# Patient Record
Sex: Male | Born: 1955 | Race: White | Hispanic: No | Marital: Married | Smoking: Never smoker
Health system: Southern US, Community
[De-identification: ages and names within clinical notes are randomized; demographics above are authoritative.]

## PROBLEM LIST (undated history)

## (undated) DIAGNOSIS — G4733 Obstructive sleep apnea (adult) (pediatric): Secondary | ICD-10-CM

## (undated) DIAGNOSIS — D509 Iron deficiency anemia, unspecified: Secondary | ICD-10-CM

## (undated) DIAGNOSIS — M1990 Unspecified osteoarthritis, unspecified site: Secondary | ICD-10-CM

## (undated) DIAGNOSIS — F329 Major depressive disorder, single episode, unspecified: Secondary | ICD-10-CM

## (undated) DIAGNOSIS — D649 Anemia, unspecified: Secondary | ICD-10-CM

## (undated) DIAGNOSIS — C44311 Basal cell carcinoma of skin of nose: Secondary | ICD-10-CM

## (undated) DIAGNOSIS — C07 Malignant neoplasm of parotid gland: Secondary | ICD-10-CM

## (undated) DIAGNOSIS — Z87442 Personal history of urinary calculi: Secondary | ICD-10-CM

## (undated) DIAGNOSIS — K219 Gastro-esophageal reflux disease without esophagitis: Secondary | ICD-10-CM

## (undated) DIAGNOSIS — F32A Depression, unspecified: Secondary | ICD-10-CM

## (undated) DIAGNOSIS — E538 Deficiency of other specified B group vitamins: Secondary | ICD-10-CM

## (undated) DIAGNOSIS — J302 Other seasonal allergic rhinitis: Secondary | ICD-10-CM

## (undated) DIAGNOSIS — G8929 Other chronic pain: Secondary | ICD-10-CM

## (undated) DIAGNOSIS — M549 Dorsalgia, unspecified: Secondary | ICD-10-CM

## (undated) DIAGNOSIS — K439 Ventral hernia without obstruction or gangrene: Secondary | ICD-10-CM

## (undated) HISTORY — DX: Malignant neoplasm of parotid gland: C07

## (undated) HISTORY — PX: PARS PLANA VITRECTOMY: SHX2166

## (undated) HISTORY — DX: Other seasonal allergic rhinitis: J30.2

## (undated) HISTORY — DX: Gastro-esophageal reflux disease without esophagitis: K21.9

## (undated) HISTORY — PX: EYE SURGERY: SHX253

## (undated) HISTORY — DX: Anemia, unspecified: D64.9

## (undated) HISTORY — PX: EXTRACORPOREAL SHOCK WAVE LITHOTRIPSY: SHX1557

## (undated) HISTORY — PX: BASAL CELL CARCINOMA EXCISION: SHX1214

## (undated) HISTORY — DX: Personal history of urinary calculi: Z87.442

## (undated) HISTORY — PX: CATARACT EXTRACTION W/ INTRAOCULAR LENS  IMPLANT, BILATERAL: SHX1307

## (undated) HISTORY — DX: Deficiency of other specified B group vitamins: E53.8

## (undated) HISTORY — DX: Iron deficiency anemia, unspecified: D50.9

---
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
## 1984-05-15 HISTORY — PX: SEPTOPLASTY: SUR1290

## 1999-05-16 HISTORY — PX: APPENDECTOMY: SHX54

## 1999-05-16 HISTORY — PX: CHOLECYSTECTOMY OPEN: SUR202

## 1999-05-16 HISTORY — PX: ROUX-EN-Y GASTRIC BYPASS: SHX1104

## 2007-06-20 ENCOUNTER — Ambulatory Visit: Payer: Self-pay | Admitting: Urology

## 2007-12-13 ENCOUNTER — Ambulatory Visit: Payer: Self-pay | Admitting: Internal Medicine

## 2009-04-12 ENCOUNTER — Ambulatory Visit: Payer: Self-pay | Admitting: Internal Medicine

## 2011-06-22 ENCOUNTER — Ambulatory Visit: Payer: Self-pay | Admitting: Gastroenterology

## 2012-02-08 ENCOUNTER — Ambulatory Visit: Payer: Self-pay | Admitting: Urology

## 2013-10-24 DIAGNOSIS — Z9884 Bariatric surgery status: Secondary | ICD-10-CM | POA: Insufficient documentation

## 2013-11-12 ENCOUNTER — Ambulatory Visit: Payer: Self-pay | Admitting: Internal Medicine

## 2013-12-13 ENCOUNTER — Ambulatory Visit: Payer: Self-pay | Admitting: Internal Medicine

## 2014-02-18 ENCOUNTER — Ambulatory Visit: Payer: Self-pay | Admitting: Internal Medicine

## 2014-03-15 ENCOUNTER — Ambulatory Visit: Payer: Self-pay | Admitting: Internal Medicine

## 2014-06-23 ENCOUNTER — Ambulatory Visit: Payer: Self-pay | Admitting: Internal Medicine

## 2014-07-14 ENCOUNTER — Ambulatory Visit
Admit: 2014-07-14 | Disposition: A | Discharge status: Home / Self Care | Payer: Self-pay | Attending: Internal Medicine | Admitting: Internal Medicine

## 2014-10-13 ENCOUNTER — Ambulatory Visit: Payer: Self-pay

## 2014-10-15 ENCOUNTER — Ambulatory Visit: Payer: Self-pay

## 2014-10-19 ENCOUNTER — Encounter: Payer: Self-pay | Admitting: Internal Medicine

## 2014-10-22 ENCOUNTER — Other Ambulatory Visit: Payer: Self-pay | Admitting: *Deleted

## 2014-10-22 ENCOUNTER — Inpatient Hospital Stay: Payer: 59 | Attending: Internal Medicine

## 2014-10-22 ENCOUNTER — Encounter (INDEPENDENT_AMBULATORY_CARE_PROVIDER_SITE_OTHER): Payer: Self-pay

## 2014-10-22 VITALS — BP 113/72 | HR 56 | Temp 96.2°F | Resp 20

## 2014-10-22 DIAGNOSIS — D509 Iron deficiency anemia, unspecified: Secondary | ICD-10-CM | POA: Insufficient documentation

## 2014-10-22 DIAGNOSIS — Z79899 Other long term (current) drug therapy: Secondary | ICD-10-CM | POA: Diagnosis not present

## 2014-10-22 MED ORDER — SODIUM CHLORIDE 0.9 % IV SOLN
100.0000 mg | Freq: Once | INTRAVENOUS | Status: AC
  Administered 2014-10-22: 100 mg via INTRAVENOUS
  Filled 2014-10-22: qty 5

## 2015-02-02 ENCOUNTER — Ambulatory Visit: Payer: 59 | Admitting: Internal Medicine

## 2015-02-02 ENCOUNTER — Ambulatory Visit: Payer: 59

## 2015-02-05 ENCOUNTER — Inpatient Hospital Stay: Payer: 59

## 2015-02-05 ENCOUNTER — Inpatient Hospital Stay: Payer: 59 | Admitting: Internal Medicine

## 2015-02-09 ENCOUNTER — Inpatient Hospital Stay: Payer: 59

## 2015-02-09 ENCOUNTER — Inpatient Hospital Stay: Payer: 59 | Admitting: Internal Medicine

## 2015-02-22 ENCOUNTER — Inpatient Hospital Stay: Payer: 59 | Admitting: Internal Medicine

## 2015-02-22 ENCOUNTER — Inpatient Hospital Stay: Payer: 59

## 2015-02-24 ENCOUNTER — Encounter: Payer: Self-pay | Admitting: Internal Medicine

## 2015-03-03 ENCOUNTER — Encounter: Payer: Self-pay | Admitting: *Deleted

## 2015-03-04 ENCOUNTER — Encounter: Payer: Self-pay | Admitting: Internal Medicine

## 2015-03-04 ENCOUNTER — Inpatient Hospital Stay: Payer: 59 | Attending: Internal Medicine | Admitting: Internal Medicine

## 2015-03-04 ENCOUNTER — Inpatient Hospital Stay: Payer: 59

## 2015-03-04 VITALS — BP 123/80 | HR 57 | Temp 96.5°F | Ht 75.0 in | Wt 240.0 lb

## 2015-03-04 DIAGNOSIS — G4730 Sleep apnea, unspecified: Secondary | ICD-10-CM | POA: Insufficient documentation

## 2015-03-04 DIAGNOSIS — K219 Gastro-esophageal reflux disease without esophagitis: Secondary | ICD-10-CM | POA: Diagnosis not present

## 2015-03-04 DIAGNOSIS — R5383 Other fatigue: Secondary | ICD-10-CM | POA: Insufficient documentation

## 2015-03-04 DIAGNOSIS — Z79899 Other long term (current) drug therapy: Secondary | ICD-10-CM | POA: Diagnosis not present

## 2015-03-04 DIAGNOSIS — Z980 Intestinal bypass and anastomosis status: Secondary | ICD-10-CM | POA: Diagnosis not present

## 2015-03-04 DIAGNOSIS — D509 Iron deficiency anemia, unspecified: Secondary | ICD-10-CM

## 2015-03-04 DIAGNOSIS — K909 Intestinal malabsorption, unspecified: Secondary | ICD-10-CM | POA: Diagnosis not present

## 2015-03-04 DIAGNOSIS — E538 Deficiency of other specified B group vitamins: Secondary | ICD-10-CM | POA: Diagnosis not present

## 2015-03-04 MED ORDER — SODIUM CHLORIDE 0.9 % IV SOLN
INTRAVENOUS | Status: DC
  Administered 2015-03-04: 15:00:00 via INTRAVENOUS
  Filled 2015-03-04: qty 1000

## 2015-03-04 MED ORDER — SODIUM CHLORIDE 0.9 % IV SOLN
200.0000 mg | Freq: Once | INTRAVENOUS | Status: AC
  Administered 2015-03-04: 200 mg via INTRAVENOUS
  Filled 2015-03-04: qty 10

## 2015-03-04 NOTE — Progress Notes (Signed)
Patient here for follow up. No complaints of tiredness.

## 2015-03-04 NOTE — Progress Notes (Signed)
[facility] [facility] OFFICE PROGRESS NOTE  Patient Care Team: Rusty Aus, MD as PCP - General (Internal Medicine)   SUMMARY OF ONCOLOGIC HISTORY: # IRON DEFICIENCY ANEMIA [sec to gastric Bypass; 2001.]   INTERVAL HISTORY:  59 year old male patient with above history of iron deficiency anemia/B12 insufficiency secondary to malabsorption/gastric bypass is here for follow-up.  Patient feels okay overall. Denies any blood in stools black stools. Denies any new onset of weight loss. He admits to improvement of his fatigue on IV iron infusions. He is on sublingual B12 pill and also by mouth iron pill.Marland Kitchen  REVIEW OF SYSTEMS:  A complete 10 point review of system is done which is negative except mentioned above/history of present illness.   PAST MEDICAL HISTORY :  Past Medical History  Diagnosis Date  . Normocytic anemia   . Iron deficiency anemia   . Seasonal allergies   . GERD (gastroesophageal reflux disease)   . Sleep apnea   . History of kidney stones   . B12 deficiency   . Cataracts, bilateral     PAST SURGICAL HISTORY :   Past Surgical History  Procedure Laterality Date  . Gastric bypass surgery  2001  . Hernia repair    . Septoplasty    . Cholecystectomy    . Appendectomy      FAMILY HISTORY :   Family History  Problem Relation Age of Onset  . Diabetes    . Heart disease    . Anemia    . Brain cancer      SOCIAL HISTORY:   Social History  Substance Use Topics  . Smoking status: Never Smoker   . Smokeless tobacco: Never Used  . Alcohol Use: No    ALLERGIES:  is allergic to penicillins.  MEDICATIONS:  Current Outpatient Prescriptions  Medication Sig Dispense Refill  . Cyanocobalamin (RA VITAMIN B-12 TR) 1000 MCG TBCR Take by mouth.    . ferrous sulfate 325 (65 FE) MG EC tablet Take by mouth.    Marland Kitchen HYDROcodone-acetaminophen (NORCO/VICODIN) 5-325 MG tablet Take by mouth.    . iron dextran complex in sodium chloride 0.9 % 100 mL Inject into the vein.     Marland Kitchen meclizine (ANTIVERT) 25 MG tablet Take by mouth.    . Multiple Vitamin (MULTI-VITAMINS) TABS Take by mouth.     No current facility-administered medications for this visit.    PHYSICAL EXAMINATION: ECOG PERFORMANCE STATUS: 0 - Asymptomatic  BP 123/80 mmHg  Pulse 57  Temp(Src) 96.5 F (35.8 C) (Tympanic)  Ht 6\' 3"  (1.905 m)  Wt 240 lb (108.863 kg)  BMI 30.00 kg/m2  SpO2 100%  Filed Weights   03/04/15 1400  Weight: 240 lb (108.863 kg)    GENERAL: Well-nourished well-developed; Alert, no distress and comfortable.   Alone.  EYES: no pallor or icterus OROPHARYNX: no thrush or ulceration; good dentition  NECK: supple, no masses felt LYMPH:  no palpable lymphadenopathy in the cervical, axillary or inguinal regions LUNGS: clear to auscultation and  No wheeze or crackles HEART/CVS: regular rate & rhythm and no murmurs; No lower extremity edema ABDOMEN:abdomen soft, non-tender and normal bowel sounds Musculoskeletal:no cyanosis of digits and no clubbing  PSYCH: alert & oriented x 3 with fluent speech NEURO: no focal motor/sensory deficits SKIN:  no rashes or significant lesions  LABORATORY DATA:  I have reviewed the data as listed No results found for: NA, K, CL, CO2, GLUCOSE, BUN, CREATININE, CALCIUM, PROT, ALBUMIN, AST, ALT, ALKPHOS, BILITOT, GFRNONAA, GFRAA  No results found for: SPEP, UPEP  No results found for: WBC, NEUTROABS, HGB, HCT, MCV, PLT    Chemistry   No results found for: NA, K, CL, CO2, BUN, CREATININE, GLU No results found for: CALCIUM, ALKPHOS, AST, ALT, BILITOT     RADIOGRAPHIC STUDIES: I have personally reviewed the radiological images as listed and agreed with the findings in the report. No results found.   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 Status post IV iron infusion hemoglobin/ferritin improved. Most recent hemoglobin 13 ferritin 40 01/17/2015 through [facility].   # However given  malabsorption- recommend IV Venofer 200 mg every 4 months. Patient will follow-up with me in 4 months with labs through [facility].  No orders of the defined types were placed in this encounter.   All questions were answered. The patient knows to call the clinic with any problems, questions or concerns. No barriers to learning was detected. I spent 15 minutes counseling the patient face to face. The total time spent in the appointment was 30 minutes and more than 50% was on counseling and review of test results     Cammie Sickle, MD 03/04/2015 2:03 PM

## 2015-05-13 ENCOUNTER — Other Ambulatory Visit: Payer: Self-pay | Admitting: Nurse Practitioner

## 2015-06-28 ENCOUNTER — Encounter: Payer: Self-pay | Admitting: Internal Medicine

## 2015-06-30 ENCOUNTER — Other Ambulatory Visit: Payer: Self-pay | Admitting: Internal Medicine

## 2015-07-05 ENCOUNTER — Inpatient Hospital Stay: Payer: 59 | Attending: Internal Medicine | Admitting: Internal Medicine

## 2015-07-05 VITALS — BP 124/82 | HR 68 | Temp 96.8°F | Resp 18 | Ht 75.0 in | Wt 250.9 lb

## 2015-07-05 DIAGNOSIS — Z87442 Personal history of urinary calculi: Secondary | ICD-10-CM | POA: Insufficient documentation

## 2015-07-05 DIAGNOSIS — G4730 Sleep apnea, unspecified: Secondary | ICD-10-CM | POA: Diagnosis not present

## 2015-07-05 DIAGNOSIS — K912 Postsurgical malabsorption, not elsewhere classified: Secondary | ICD-10-CM | POA: Diagnosis not present

## 2015-07-05 DIAGNOSIS — Z79899 Other long term (current) drug therapy: Secondary | ICD-10-CM

## 2015-07-05 DIAGNOSIS — D508 Other iron deficiency anemias: Secondary | ICD-10-CM | POA: Insufficient documentation

## 2015-07-05 DIAGNOSIS — Z9884 Bariatric surgery status: Secondary | ICD-10-CM | POA: Insufficient documentation

## 2015-07-05 DIAGNOSIS — R5383 Other fatigue: Secondary | ICD-10-CM | POA: Diagnosis not present

## 2015-07-05 DIAGNOSIS — K219 Gastro-esophageal reflux disease without esophagitis: Secondary | ICD-10-CM | POA: Insufficient documentation

## 2015-07-05 DIAGNOSIS — E538 Deficiency of other specified B group vitamins: Secondary | ICD-10-CM | POA: Diagnosis not present

## 2015-07-05 NOTE — Progress Notes (Signed)
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  Complains of worsening fatigue over the last few weeks. Denies any blood in stools black stools. No chest pain or shortness of breath or cough. Patient's symptoms of fatigue and malaise improved after IV iron infusion.  REVIEW OF SYSTEMS:  A complete 10 point review of system is done which is negative except mentioned above/history of present illness.   PAST MEDICAL HISTORY :  Past Medical History  Diagnosis Date  . Normocytic anemia   . Iron deficiency anemia   . Seasonal allergies   . GERD (gastroesophageal reflux disease)   . Sleep apnea   . History of kidney stones   . B12 deficiency   . Cataracts, bilateral     PAST SURGICAL HISTORY :   Past Surgical History  Procedure Laterality Date  . Gastric bypass surgery  2001  . Hernia repair    . Septoplasty    . Cholecystectomy    . Appendectomy    . Cataracts Bilateral     FAMILY HISTORY :   Family History  Problem Relation Age of Onset  . Diabetes    . Heart disease    . Anemia    . Brain cancer      SOCIAL HISTORY:   Social History  Substance Use Topics  . Smoking status: Never Smoker   . Smokeless tobacco: Never Used  . Alcohol Use: No    ALLERGIES:  is allergic to penicillins.  MEDICATIONS:  Current Outpatient Prescriptions  Medication Sig Dispense Refill  . Cyanocobalamin (RA VITAMIN B-12 TR) 1000 MCG TBCR Take by mouth.    . ferrous sulfate 325 (65 FE) MG EC tablet Take by mouth.    Marland Kitchen HYDROcodone-acetaminophen (NORCO/VICODIN) 5-325 MG tablet Take by mouth.    . iron dextran complex in sodium  chloride 0.9 % 100 mL Inject into the vein.    . Multiple Vitamin (MULTI-VITAMINS) TABS Take by mouth.     No current facility-administered medications for this visit.    PHYSICAL EXAMINATION: ECOG PERFORMANCE STATUS: 0 - Asymptomatic  BP 124/82 mmHg  Pulse 68  Temp(Src) 96.8 F (36 C) (Tympanic)  Resp 18  Ht 6\' 3"  (1.905 m)  Wt 250 lb 14.1 oz (113.8 kg)  BMI 31.36 kg/m2  Filed Weights   07/05/15 1503  Weight: 250 lb 14.1 oz (113.8 kg)    GENERAL: Well-nourished well-developed; Alert, no distress and comfortable.   Alone.  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 Status post IV iron infusion hemoglobin/ferritin improved; on maintenance IV Venofer every 4 months. Patient's last  IV infusion was in September 2016.  Most recent ferritin is 52/hemoglobin is 13- the patient is symptomatic/increasing fatigue- likely from functional iron deficiency.   # recommend Venofer this week; IV Venofer 200 mg every 4 months/labs in 4 months  # patient will follow-up with me in 8 months/labs/ IV Venofer.  # 15 minutes face-to-face with the patient discussing the above plan of care; more than 50% of time spent on natural history; counseling and coordination.       Cammie Sickle, MD 07/05/2015 3:33 PM

## 2015-07-08 ENCOUNTER — Inpatient Hospital Stay: Payer: 59

## 2015-07-26 ENCOUNTER — Emergency Department
Admission: EM | Admit: 2015-07-26 | Discharge: 2015-07-26 | Disposition: A | Discharge status: Home / Self Care | Payer: 59 | Attending: Emergency Medicine | Admitting: Emergency Medicine

## 2015-07-26 ENCOUNTER — Emergency Department: Payer: 59

## 2015-07-26 ENCOUNTER — Encounter: Payer: Self-pay | Admitting: Emergency Medicine

## 2015-07-26 DIAGNOSIS — R51 Headache: Secondary | ICD-10-CM | POA: Insufficient documentation

## 2015-07-26 DIAGNOSIS — F439 Reaction to severe stress, unspecified: Secondary | ICD-10-CM | POA: Insufficient documentation

## 2015-07-26 DIAGNOSIS — Z880 Allergy status to penicillin: Secondary | ICD-10-CM | POA: Diagnosis not present

## 2015-07-26 DIAGNOSIS — R1013 Epigastric pain: Secondary | ICD-10-CM | POA: Insufficient documentation

## 2015-07-26 DIAGNOSIS — F419 Anxiety disorder, unspecified: Secondary | ICD-10-CM | POA: Diagnosis not present

## 2015-07-26 DIAGNOSIS — Z79899 Other long term (current) drug therapy: Secondary | ICD-10-CM | POA: Insufficient documentation

## 2015-07-26 DIAGNOSIS — R079 Chest pain, unspecified: Secondary | ICD-10-CM | POA: Insufficient documentation

## 2015-07-26 LAB — CBC
HCT: 42.5 % (ref 40.0–52.0)
Hemoglobin: 14.4 g/dL (ref 13.0–18.0)
MCH: 32.4 pg (ref 26.0–34.0)
MCHC: 33.9 g/dL (ref 32.0–36.0)
MCV: 95.6 fL (ref 80.0–100.0)
Platelets: 203 10*3/uL (ref 150–440)
RBC: 4.45 MIL/uL (ref 4.40–5.90)
RDW: 12.9 % (ref 11.5–14.5)
WBC: 6.8 10*3/uL (ref 3.8–10.6)

## 2015-07-26 LAB — BASIC METABOLIC PANEL
Anion gap: 8 (ref 5–15)
BUN: 18 mg/dL (ref 6–20)
CO2: 23 mmol/L (ref 22–32)
Calcium: 8.8 mg/dL — ABNORMAL LOW (ref 8.9–10.3)
Chloride: 109 mmol/L (ref 101–111)
Creatinine, Ser: 0.93 mg/dL (ref 0.61–1.24)
GFR calc Af Amer: >60 mL/min (ref >60)
GFR calc non Af Amer: >60 mL/min (ref >60)
Glucose, Bld: 90 mg/dL (ref 65–99)
Potassium: 3.9 mmol/L (ref 3.5–5.1)
Sodium: 140 mmol/L (ref 135–145)

## 2015-07-26 LAB — TROPONIN I
Troponin I: <0.03 ng/mL (ref <0.031)
Troponin I: <0.03 ng/mL (ref <0.031)

## 2015-07-26 MED ORDER — LORAZEPAM 1 MG PO TABS
1.0000 mg | ORAL_TABLET | Freq: Once | ORAL | Status: AC
  Administered 2015-07-26: 1 mg via ORAL
  Filled 2015-07-26: qty 1

## 2015-07-26 MED ORDER — ASPIRIN EC 81 MG PO TBEC
81.0000 mg | DELAYED_RELEASE_TABLET | Freq: Once | ORAL | Status: AC
  Administered 2015-07-26: 81 mg via ORAL
  Filled 2015-07-26: qty 1

## 2015-07-26 NOTE — ED Provider Notes (Signed)
Time Seen: Approximately *1530 I have reviewed the triage notes  Chief Complaint: Chest Pain   History of Present Illness: [name] is a 60 y.o. male who presents with onset of chest discomfort that's been intermittent now for the past several weeks but seemed to be worse today. The patient states he noticed the discomfort this morning when he got up and he continued while he was at work. This discomfort started approximately 7 to 7:30 this morning. He denies any shortness of breath, nausea, vomiting. Denies any arm, jaw, or neck pain. He goes on to state that he had a headache which is been also occurring over the last several days worse yesterday with pain up into the left side of his head and described as a throbbing headache. He denies any midline neck pain, photophobia, weakness, fever, head trauma, or any other concerns. The patient states he's been under a lot of stress at work and he feels that a lot of these symptoms may be related.   Past Medical History  Diagnosis Date  . Normocytic anemia   . Iron deficiency anemia   . Seasonal allergies   . GERD (gastroesophageal reflux disease)   . Sleep apnea   . History of kidney stones   . B12 deficiency   . Cataracts, bilateral     Patient Active Problem List   Diagnosis Date Noted  . Iron deficiency anemia 03/04/2015    Past Surgical History  Procedure Laterality Date  . Gastric bypass surgery  2001  . Hernia repair    . Septoplasty    . Cholecystectomy    . Appendectomy    . Cataracts Bilateral     Past Surgical History  Procedure Laterality Date  . Gastric bypass surgery  2001  . Hernia repair    . Septoplasty    . Cholecystectomy    . Appendectomy    . Cataracts Bilateral     Current Outpatient Rx  Name  Route  Sig  Dispense  Refill  . Cyanocobalamin (RA VITAMIN B-12 TR) 1000 MCG TBCR   Oral   Take by mouth.         . ferrous sulfate 325 (65 FE) MG EC tablet   Oral   Take by mouth.          Marland Kitchen HYDROcodone-acetaminophen (NORCO/VICODIN) 5-325 MG tablet   Oral   Take by mouth.         . iron dextran complex in sodium chloride 0.9 % 100 mL   Intravenous   Inject into the vein.         . Multiple Vitamin (MULTI-VITAMINS) TABS   Oral   Take by mouth.           Allergies:  Penicillins  Family History: Family History  Problem Relation Age of Onset  . Diabetes    . Heart disease    . Anemia    . Brain cancer      Social History: Social History  Substance Use Topics  . Smoking status: Never Smoker   . Smokeless tobacco: Never Used  . Alcohol Use: No     Review of Systems:   10 point review of systems was performed and was otherwise negative:  Constitutional: No fever Eyes: No visual disturbances ENT: No sore throat, ear pain Cardiac: Left-sided chest achiness. He denies any radiation to the back or flank area. He denies any reflux or burning sensation. Respiratory: No shortness of breath, wheezing, or stridor  Abdomen: No abdominal pain, no vomiting, No diarrhea Endocrine: No weight loss, No night sweats Extremities: No peripheral edema, cyanosis Skin: No rashes, easy bruising Neurologic: No focal weakness, trouble with speech or swollowing Urologic: No dysuria, Hematuria, or urinary frequency   Physical Exam:  ED Triage Vitals  Enc Vitals Group     BP 07/26/15 1205 179/77 mmHg     Pulse Rate 07/26/15 1205 63     Resp 07/26/15 1205 18     Temp 07/26/15 1205 98 F (36.7 C)     Temp Source 07/26/15 1205 Oral     SpO2 07/26/15 1205 99 %     Weight 07/26/15 1205 240 lb (108.863 kg)     Height 07/26/15 1205 6\' 3"  (1.905 m)     Head Cir --      Peak Flow --      Pain Score 07/26/15 1202 8     Pain Loc --      Pain Edu? --      Excl. in [place]? --     General: Awake , Alert , and Oriented times 3; GCS 15 Head: Normal cephalic , atraumatic. No obvious reproducible component to his headache Eyes: Pupils equal , round, reactive to  light Nose/Throat: No nasal drainage, patent upper airway without erythema or exudate.  Neck: Supple, Full range of motion, No anterior adenopathy or palpable thyroid masses. No meningeal signs Lungs: Clear to ascultation without wheezes , rhonchi, or rales Heart: Regular rate, regular rhythm without murmurs , gallops , or rubs Abdomen: Mild tenderness over the epigastric area without rebound, guarding , or rigidity; bowel sounds positive and symmetric in all 4 quadrants. No organomegaly .        Extremities: 2 plus symmetric pulses. No edema, clubbing or cyanosis Neurologic: normal ambulation, Motor symmetric without deficits, sensory intact Skin: warm, dry, no rashes   Labs:   All laboratory work was reviewed including any pertinent negatives or positives listed below:  [place] - Abnormal; Notable for the following:    Calcium 8.8 (*)    All other components within normal limits  CBC  TROPONIN I  TROPONIN I   review laboratory work showed no abnormalities and serial troponins were negative  EKG: *  ED ECG REPORT I, Daymon Larsen, the attending physician, personally viewed and interpreted this ECG.  Date: 07/26/2015 EKG Time: *1202 Rate: 64 Rhythm: normal sinus rhythm QRS Axis: normal Intervals: normal ST/T Wave abnormalities: normal Conduction Disturbances: none Narrative Interpretation: unremarkable Normal EKG with artifact   Radiology:   CLINICAL DATA: 60 year old male with headaches for the past week. Recent stress. Initial encounter.  EXAM: CT HEAD WITHOUT CONTRAST  TECHNIQUE: Contiguous axial images were obtained from the base of the skull through the vertex without intravenous contrast.  COMPARISON: None.  FINDINGS: No intracranial hemorrhage or CT evidence of large acute infarct.  Slight asymmetry ventricles felt to be within range of normal limits without hydrocephalus.  No intracranial mass lesion noted on this  unenhanced exam.  Post lens replacement otherwise orbital structures unremarkable.  Mastoid air cells, middle ear cavities and visualized paranasal sinuses are clear.  IMPRESSION: No acute intracranial abnormality noted. Please see above. morning  EXAM: CHEST 2 VIEW  COMPARISON: None.  FINDINGS: The heart size and mediastinal contours are within normal limits. Both lungs are clear. The visualized skeletal structures are unremarkable.  IMPRESSION: No active cardiopulmonary disease.      I personally reviewed the  radiologic studies    ED Course:  Patient's stay here was uneventful and he had some symptomatic improvement with some Ativan. Given the nature of his headache and stress related, I felt this was unlikely to be a life-threatening headache. We discussed a lumbar puncture but both parties felt that with a negative head CT and a lumbar puncture was not necessary especially given the presentation and there was concern because he does have a family history of cerebral aneurysm. The patient's chest pain is unlikely to be a life-threatening cause also given the length of time and negative objective findings here in emergency department for ischemic heart disease. Also felt was unlikely to be an aortic dissection or pulmonary embolism. Patient was advised his workup is not complete and he does need to follow up with his primary physician.    Assessment: * Acute unspecified chest pain Acute cephalgia likely muscle tension Anxiety  Final Clinical Impression:  * Final diagnoses:  Chest pain, unspecified chest pain type     Plan: Outpatient management Patient was advised to return immediately if condition worsens. Patient was advised to follow up with their primary care physician or other specialized physicians involved in their outpatient care All review of laboratory work and objective findings this time and also with the patient's wife and all parties appear to be  of understanding that his workup should continue on an outpatient basis. He should take an aspirin a day.           Daymon Larsen, MD 07/26/15 2027

## 2015-07-26 NOTE — ED Notes (Signed)
Pt reports tightness in chest and a headache for several weeks that was worse yesterday - Pt reports he is under a "tremendous amount of stress at work" that has been going on for quite a while - Denies nausea and vomiting - Denies radiation of pain - Reports history of anxiety worse over the last 18 months

## 2015-07-26 NOTE — Discharge Instructions (Signed)
Nonspecific Chest Pain It is often hard to find the cause of chest pain. There is always a chance that your pain could be related to something serious, such as a heart attack or a blood clot in your lungs. Chest pain can also be caused by conditions that are not life-threatening. If you have chest pain, it is very important to follow up with your doctor.  HOME CARE  If you were prescribed an antibiotic medicine, finish it all even if you start to feel better.  Avoid any activities that cause chest pain.  Do not use any tobacco products, including cigarettes, chewing tobacco, or electronic cigarettes. If you need help quitting, ask your doctor.  Do not drink alcohol.  Take medicines only as told by your doctor.  Keep all follow-up visits as told by your doctor. This is important. This includes any further testing if your chest pain does not go away.  Your doctor may tell you to keep your head raised (elevated) while you sleep.  Make lifestyle changes as told by your doctor. These may include:  Getting regular exercise. Ask your doctor to suggest some activities that are safe for you.  Eating a heart-healthy diet. Your doctor or a diet specialist (dietitian) can help you to learn healthy eating options.  Maintaining a healthy weight.  Managing diabetes, if necessary.  Reducing stress. GET HELP IF:  Your chest pain does not go away, even after treatment.  You have a rash with blisters on your chest.  You have a fever. GET HELP RIGHT AWAY IF:  Your chest pain is worse.  You have an increasing cough, or you cough up blood.  You have severe belly (abdominal) pain.  You feel extremely weak.  You pass out (faint).  You have chills.  You have sudden, unexplained chest discomfort.  You have sudden, unexplained discomfort in your arms, back, neck, or jaw.  You have shortness of breath at any time.  You suddenly start to sweat, or your skin gets clammy.  You feel  nauseous.  You vomit.  You suddenly feel light-headed or dizzy.  Your heart begins to beat quickly, or it feels like it is skipping beats. These symptoms may be an emergency. Do not wait to see if the symptoms will go away. Get medical help right away. Call your local emergency services (911 in the [place] Do not drive yourself to the hospital.   This information is not intended to replace advice given to you by your health care provider. Make sure you discuss any questions you have with your health care provider.   Document Released: 10/18/2007 Document Revised: 05/22/2014 Document Reviewed: 12/05/2013 Elsevier Interactive Patient Education [employer].  Please return immediately if condition worsens. Please contact her primary physician or the physician you were given for referral. If you have any specialist physicians involved in her treatment and plan please also contact them. Thank you for using [facility] emergency [facility]. Please take an aspirin a day and contact her primary physician for likely cardiology referral.

## 2015-07-26 NOTE — ED Notes (Signed)
Pt c/o left sided constant chest tightness that started this morning.  Denies any other sx with it. No cardiac hx. Has been under a lot of stress at work.

## 2015-10-20 ENCOUNTER — Encounter: Payer: Self-pay | Admitting: Internal Medicine

## 2015-10-22 ENCOUNTER — Other Ambulatory Visit: Payer: Self-pay | Admitting: Internal Medicine

## 2015-10-26 ENCOUNTER — Inpatient Hospital Stay: Payer: 59

## 2015-11-04 ENCOUNTER — Ambulatory Visit: Payer: 59

## 2016-01-05 ENCOUNTER — Encounter (INDEPENDENT_AMBULATORY_CARE_PROVIDER_SITE_OTHER): Payer: Self-pay | Admitting: Ophthalmology

## 2016-01-11 ENCOUNTER — Encounter (INDEPENDENT_AMBULATORY_CARE_PROVIDER_SITE_OTHER): Payer: 59 | Admitting: Ophthalmology

## 2016-01-11 DIAGNOSIS — H44752 Retained (nonmagnetic) (old) foreign body in vitreous body, left eye: Secondary | ICD-10-CM

## 2016-01-11 DIAGNOSIS — H35412 Lattice degeneration of retina, left eye: Secondary | ICD-10-CM | POA: Diagnosis not present

## 2016-01-11 DIAGNOSIS — H43813 Vitreous degeneration, bilateral: Secondary | ICD-10-CM | POA: Diagnosis not present

## 2016-01-11 DIAGNOSIS — H33302 Unspecified retinal break, left eye: Secondary | ICD-10-CM

## 2016-01-20 ENCOUNTER — Ambulatory Visit (INDEPENDENT_AMBULATORY_CARE_PROVIDER_SITE_OTHER): Payer: 59 | Admitting: Ophthalmology

## 2016-01-20 DIAGNOSIS — H33302 Unspecified retinal break, left eye: Secondary | ICD-10-CM

## 2016-02-17 ENCOUNTER — Encounter: Payer: Self-pay | Admitting: Internal Medicine

## 2016-03-01 ENCOUNTER — Encounter (INDEPENDENT_AMBULATORY_CARE_PROVIDER_SITE_OTHER): Payer: 59 | Admitting: Ophthalmology

## 2016-03-02 ENCOUNTER — Encounter (INDEPENDENT_AMBULATORY_CARE_PROVIDER_SITE_OTHER): Payer: 59 | Admitting: Ophthalmology

## 2016-03-02 ENCOUNTER — Inpatient Hospital Stay: Payer: Self-pay | Admitting: Internal Medicine

## 2016-03-02 ENCOUNTER — Inpatient Hospital Stay: Payer: Self-pay

## 2016-03-02 DIAGNOSIS — H43813 Vitreous degeneration, bilateral: Secondary | ICD-10-CM

## 2016-03-02 DIAGNOSIS — H44752 Retained (nonmagnetic) (old) foreign body in vitreous body, left eye: Secondary | ICD-10-CM | POA: Diagnosis not present

## 2016-03-02 DIAGNOSIS — H33022 Retinal detachment with multiple breaks, left eye: Secondary | ICD-10-CM | POA: Diagnosis not present

## 2016-03-02 NOTE — H&P (Signed)
[name] is an 60 y.o. male.   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 B12 deficiency   . Cataracts, bilateral   . GERD (gastroesophageal reflux disease)   . History of kidney stones   . Iron deficiency anemia   . Normocytic anemia   . Seasonal allergies   . Sleep apnea     Past Surgical History:  Procedure Laterality Date  . APPENDECTOMY    . cataracts Bilateral   . CHOLECYSTECTOMY    . gastric bypass surgery  2001  . HERNIA REPAIR    . SEPTOPLASTY      Family History  Problem Relation Age of Onset  . Diabetes    . Heart disease    . Anemia    . Brain cancer     Social History:  reports that he has never smoked. He has never used smokeless tobacco. He reports that he does not drink alcohol or use drugs.  Allergies:  Allergies  Allergen Reactions  . Penicillins Hives    No prescriptions prior to admission.    Review of systems otherwise negative  There were no vitals taken for this visit.  Physical exam: Mental status: oriented x3. Eyes: See eye exam associated with this date of surgery in media tab.  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

## 2016-03-27 ENCOUNTER — Encounter (HOSPITAL_COMMUNITY): Payer: Self-pay | Admitting: *Deleted

## 2016-03-27 NOTE — Progress Notes (Signed)
Spoke with pt for pre-op call. Pt denies cardiac history, chest pain or sob. 

## 2016-03-28 ENCOUNTER — Encounter (HOSPITAL_COMMUNITY): Payer: Self-pay | Admitting: *Deleted

## 2016-03-28 ENCOUNTER — Ambulatory Visit (HOSPITAL_COMMUNITY)
Admission: RE | Admit: 2016-03-28 | Discharge: 2016-03-29 | Disposition: A | Discharge status: Home / Self Care | Payer: 59 | Source: Ambulatory Visit | Attending: Ophthalmology | Admitting: Ophthalmology

## 2016-03-28 ENCOUNTER — Ambulatory Visit (HOSPITAL_COMMUNITY): Payer: 59 | Admitting: Certified Registered Nurse Anesthetist

## 2016-03-28 ENCOUNTER — Encounter (HOSPITAL_COMMUNITY)
Admission: RE | Disposition: A | Discharge status: Home / Self Care | Payer: Self-pay | Source: Ambulatory Visit | Attending: Ophthalmology

## 2016-03-28 DIAGNOSIS — H27132 Posterior dislocation of lens, left eye: Secondary | ICD-10-CM | POA: Diagnosis not present

## 2016-03-28 DIAGNOSIS — K219 Gastro-esophageal reflux disease without esophagitis: Secondary | ICD-10-CM | POA: Diagnosis not present

## 2016-03-28 DIAGNOSIS — H21232 Degeneration of iris (pigmentary), left eye: Secondary | ICD-10-CM | POA: Diagnosis not present

## 2016-03-28 DIAGNOSIS — E538 Deficiency of other specified B group vitamins: Secondary | ICD-10-CM | POA: Diagnosis not present

## 2016-03-28 DIAGNOSIS — Z880 Allergy status to penicillin: Secondary | ICD-10-CM | POA: Diagnosis not present

## 2016-03-28 DIAGNOSIS — T8522XA Displacement of intraocular lens, initial encounter: Secondary | ICD-10-CM | POA: Insufficient documentation

## 2016-03-28 DIAGNOSIS — G4730 Sleep apnea, unspecified: Secondary | ICD-10-CM | POA: Insufficient documentation

## 2016-03-28 DIAGNOSIS — Z8249 Family history of ischemic heart disease and other diseases of the circulatory system: Secondary | ICD-10-CM | POA: Diagnosis not present

## 2016-03-28 DIAGNOSIS — Z808 Family history of malignant neoplasm of other organs or systems: Secondary | ICD-10-CM | POA: Insufficient documentation

## 2016-03-28 DIAGNOSIS — H401320 Pigmentary glaucoma, left eye, stage unspecified: Secondary | ICD-10-CM | POA: Diagnosis present

## 2016-03-28 DIAGNOSIS — Z9884 Bariatric surgery status: Secondary | ICD-10-CM | POA: Insufficient documentation

## 2016-03-28 DIAGNOSIS — Y772 Prosthetic and other implants, materials and accessory ophthalmic devices associated with adverse incidents: Secondary | ICD-10-CM | POA: Insufficient documentation

## 2016-03-28 DIAGNOSIS — H27139 Posterior dislocation of lens, unspecified eye: Secondary | ICD-10-CM | POA: Diagnosis present

## 2016-03-28 DIAGNOSIS — Z833 Family history of diabetes mellitus: Secondary | ICD-10-CM | POA: Insufficient documentation

## 2016-03-28 HISTORY — PX: AIR/FLUID EXCHANGE: SHX6494

## 2016-03-28 HISTORY — DX: Obstructive sleep apnea (adult) (pediatric): G47.33

## 2016-03-28 HISTORY — DX: Basal cell carcinoma of skin of nose: C44.311

## 2016-03-28 HISTORY — DX: Major depressive disorder, single episode, unspecified: F32.9

## 2016-03-28 HISTORY — DX: Ventral hernia without obstruction or gangrene: K43.9

## 2016-03-28 HISTORY — DX: Other chronic pain: G89.29

## 2016-03-28 HISTORY — DX: Unspecified osteoarthritis, unspecified site: M19.90

## 2016-03-28 HISTORY — DX: Dorsalgia, unspecified: M54.9

## 2016-03-28 HISTORY — PX: PARS PLANA VITRECTOMY: SHX2166

## 2016-03-28 HISTORY — DX: Depression, unspecified: F32.A

## 2016-03-28 LAB — CBC
HCT: 41 % (ref 39.0–52.0)
Hemoglobin: 13.9 g/dL (ref 13.0–17.0)
MCH: 32.3 pg (ref 26.0–34.0)
MCHC: 33.9 g/dL (ref 30.0–36.0)
MCV: 95.3 fL (ref 78.0–100.0)
Platelets: 184 10*3/uL (ref 150–400)
RBC: 4.3 MIL/uL (ref 4.22–5.81)
RDW: 12.8 % (ref 11.5–15.5)
WBC: 6.5 10*3/uL (ref 4.0–10.5)

## 2016-03-28 SURGERY — PARS PLANA VITRECTOMY WITH 25G REMOVAL/SUTURE INTRAOCULAR LENS
Anesthesia: General | Site: Eye | Laterality: Left

## 2016-03-28 MED ORDER — SODIUM CHLORIDE 0.9 % IV SOLN
INTRAVENOUS | Status: DC
  Administered 2016-03-28: 11:00:00 via INTRAVENOUS

## 2016-03-28 MED ORDER — PHENYLEPHRINE 40 MCG/ML (10ML) SYRINGE FOR IV PUSH (FOR BLOOD PRESSURE SUPPORT)
PREFILLED_SYRINGE | INTRAVENOUS | Status: DC | PRN
  Administered 2016-03-28 (×2): 40 ug via INTRAVENOUS

## 2016-03-28 MED ORDER — TROPICAMIDE 1 % OP SOLN: 1.0000 [drp] | OPHTHALMIC | Status: DC | PRN

## 2016-03-28 MED ORDER — ADULT MULTIVITAMIN W/MINERALS CH
1.0000 | ORAL_TABLET | Freq: Every day | ORAL | Status: DC
Start: 2016-03-28 — End: 2016-03-29
  Administered 2016-03-28: 1 via ORAL
  Filled 2016-03-28: qty 1

## 2016-03-28 MED ORDER — TETRACAINE HCL 0.5 % OP SOLN
2.0000 [drp] | Freq: Once | OPHTHALMIC | Status: DC
  Filled 2016-03-28: qty 2

## 2016-03-28 MED ORDER — HYDROCODONE-ACETAMINOPHEN 5-325 MG PO TABS
1.0000 | ORAL_TABLET | ORAL | Status: DC | PRN
  Administered 2016-03-28 – 2016-03-29 (×4): 2 via ORAL
  Filled 2016-03-28 (×4): qty 2

## 2016-03-28 MED ORDER — MIDAZOLAM HCL 2 MG/2ML IJ SOLN
INTRAMUSCULAR | Status: AC
  Filled 2016-03-28: qty 2

## 2016-03-28 MED ORDER — EPINEPHRINE PF 1 MG/ML IJ SOLN
INTRAOCULAR | Status: DC | PRN
  Administered 2016-03-28: 12:00:00

## 2016-03-28 MED ORDER — PROPOFOL 10 MG/ML IV BOLUS
INTRAVENOUS | Status: AC
  Filled 2016-03-28: qty 40

## 2016-03-28 MED ORDER — LIDOCAINE 2% (20 MG/ML) 5 ML SYRINGE
INTRAMUSCULAR | Status: DC | PRN
  Administered 2016-03-28: 20 mg via INTRAVENOUS

## 2016-03-28 MED ORDER — BACITRACIN-POLYMYXIN B 500-10000 UNIT/GM OP OINT
TOPICAL_OINTMENT | OPHTHALMIC | Status: AC
  Filled 2016-03-28: qty 3.5

## 2016-03-28 MED ORDER — CYCLOPENTOLATE HCL 1 % OP SOLN
OPHTHALMIC | Status: AC
  Administered 2016-03-28: 1 [drp] via OPHTHALMIC
  Filled 2016-03-28: qty 2

## 2016-03-28 MED ORDER — TEMAZEPAM 15 MG PO CAPS
15.0000 mg | ORAL_CAPSULE | Freq: Every evening | ORAL | Status: DC | PRN
  Administered 2016-03-28: 15 mg via ORAL
  Filled 2016-03-28: qty 1

## 2016-03-28 MED ORDER — GATIFLOXACIN 0.5 % OP SOLN
1.0000 [drp] | Freq: Four times a day (QID) | OPHTHALMIC | Status: DC
  Filled 2016-03-28: qty 2.5

## 2016-03-28 MED ORDER — PANTOPRAZOLE SODIUM 40 MG PO TBEC: 40.0000 mg | DELAYED_RELEASE_TABLET | Freq: Every day | ORAL | Status: DC

## 2016-03-28 MED ORDER — TROPICAMIDE 1 % OP SOLN
1.0000 [drp] | OPHTHALMIC | Status: AC | PRN
  Administered 2016-03-28 (×3): 1 [drp] via OPHTHALMIC

## 2016-03-28 MED ORDER — FENTANYL CITRATE (PF) 100 MCG/2ML IJ SOLN: 25.0000 ug | INTRAMUSCULAR | Status: DC | PRN

## 2016-03-28 MED ORDER — CYCLOPENTOLATE HCL 1 % OP SOLN
1.0000 [drp] | OPHTHALMIC | Status: AC | PRN
  Administered 2016-03-28 (×3): 1 [drp] via OPHTHALMIC

## 2016-03-28 MED ORDER — CYCLOPENTOLATE HCL 1 % OP SOLN: 1.0000 [drp] | OPHTHALMIC | Status: DC | PRN

## 2016-03-28 MED ORDER — MIDAZOLAM HCL 2 MG/2ML IJ SOLN: 0.5000 mg | Freq: Once | INTRAMUSCULAR | Status: DC | PRN

## 2016-03-28 MED ORDER — HYPROMELLOSE (GONIOSCOPIC) 2.5 % OP SOLN
OPHTHALMIC | Status: AC
  Filled 2016-03-28: qty 15

## 2016-03-28 MED ORDER — MAGNESIUM HYDROXIDE 400 MG/5ML PO SUSP: 15.0000 mL | Freq: Four times a day (QID) | ORAL | Status: DC | PRN

## 2016-03-28 MED ORDER — ONDANSETRON HCL 4 MG/2ML IJ SOLN
INTRAMUSCULAR | Status: DC | PRN
  Administered 2016-03-28: 4 mg via INTRAVENOUS

## 2016-03-28 MED ORDER — ATROPINE SULFATE 1 % OP SOLN
OPHTHALMIC | Status: AC
  Filled 2016-03-28: qty 5

## 2016-03-28 MED ORDER — BACITRACIN-POLYMYXIN B 500-10000 UNIT/GM OP OINT
1.0000 "application " | TOPICAL_OINTMENT | Freq: Three times a day (TID) | OPHTHALMIC | Status: DC
  Filled 2016-03-28: qty 3.5

## 2016-03-28 MED ORDER — ACETAMINOPHEN 325 MG PO TABS: 325.0000 mg | ORAL_TABLET | ORAL | Status: DC | PRN

## 2016-03-28 MED ORDER — DORZOLAMIDE HCL-TIMOLOL MAL 2-0.5 % OP SOLN
1.0000 [drp] | Freq: Two times a day (BID) | OPHTHALMIC | Status: DC
  Administered 2016-03-28: 1 [drp] via OPHTHALMIC
  Filled 2016-03-28 (×2): qty 10

## 2016-03-28 MED ORDER — GATIFLOXACIN 0.5 % OP SOLN: 1.0000 [drp] | OPHTHALMIC | Status: DC | PRN

## 2016-03-28 MED ORDER — ONDANSETRON HCL 4 MG/2ML IJ SOLN
INTRAMUSCULAR | Status: AC
  Filled 2016-03-28: qty 2

## 2016-03-28 MED ORDER — FENTANYL CITRATE (PF) 100 MCG/2ML IJ SOLN
INTRAMUSCULAR | Status: AC
  Filled 2016-03-28: qty 2

## 2016-03-28 MED ORDER — PHENYLEPHRINE HCL 2.5 % OP SOLN
1.0000 [drp] | OPHTHALMIC | Status: AC | PRN
  Administered 2016-03-28 (×3): 1 [drp] via OPHTHALMIC

## 2016-03-28 MED ORDER — ETODOLAC 400 MG PO TABS
400.0000 mg | ORAL_TABLET | Freq: Two times a day (BID) | ORAL | Status: DC
  Administered 2016-03-28 – 2016-03-29 (×2): 400 mg via ORAL
  Filled 2016-03-28 (×3): qty 1

## 2016-03-28 MED ORDER — SUGAMMADEX SODIUM 500 MG/5ML IV SOLN
INTRAVENOUS | Status: DC | PRN
  Administered 2016-03-28: 216 mg via INTRAVENOUS

## 2016-03-28 MED ORDER — FENTANYL CITRATE (PF) 100 MCG/2ML IJ SOLN
INTRAMUSCULAR | Status: DC | PRN
  Administered 2016-03-28 (×2): 50 ug via INTRAVENOUS

## 2016-03-28 MED ORDER — MEPERIDINE HCL 25 MG/ML IJ SOLN: 6.2500 mg | INTRAMUSCULAR | Status: DC | PRN

## 2016-03-28 MED ORDER — STERILE WATER FOR IRRIGATION IR SOLN
Status: DC | PRN
  Administered 2016-03-28: 200 mL

## 2016-03-28 MED ORDER — BSS IO SOLN
INTRAOCULAR | Status: AC
Start: 2016-03-28 — End: 2016-03-28
  Filled 2016-03-28: qty 15

## 2016-03-28 MED ORDER — LATANOPROST 0.005 % OP SOLN
1.0000 [drp] | Freq: Every day | OPHTHALMIC | Status: DC
  Filled 2016-03-28: qty 2.5

## 2016-03-28 MED ORDER — BUPIVACAINE HCL (PF) 0.75 % IJ SOLN
INTRAMUSCULAR | Status: AC
  Filled 2016-03-28: qty 10

## 2016-03-28 MED ORDER — BUPIVACAINE HCL (PF) 0.75 % IJ SOLN
INTRAMUSCULAR | Status: DC | PRN
  Administered 2016-03-28: 10 mL

## 2016-03-28 MED ORDER — FERROUS SULFATE 325 (65 FE) MG PO TABS
325.0000 mg | ORAL_TABLET | Freq: Every day | ORAL | Status: DC
  Administered 2016-03-28: 325 mg via ORAL
  Filled 2016-03-28: qty 1

## 2016-03-28 MED ORDER — HEMOSTATIC AGENTS (NO CHARGE) OPTIME
TOPICAL | Status: DC | PRN
  Administered 2016-03-28: 1 via TOPICAL

## 2016-03-28 MED ORDER — STERILE WATER FOR INJECTION IJ SOLN
INTRAMUSCULAR | Status: AC
  Filled 2016-03-28: qty 20

## 2016-03-28 MED ORDER — FENTANYL CITRATE (PF) 100 MCG/2ML IJ SOLN
25.0000 ug | INTRAMUSCULAR | Status: DC | PRN
  Administered 2016-03-28: 50 ug via INTRAVENOUS

## 2016-03-28 MED ORDER — PROMETHAZINE HCL 25 MG/ML IJ SOLN: 6.2500 mg | INTRAMUSCULAR | Status: DC | PRN

## 2016-03-28 MED ORDER — TROPICAMIDE 1 % OP SOLN
OPHTHALMIC | Status: AC
  Administered 2016-03-28: 1 [drp] via OPHTHALMIC
  Filled 2016-03-28: qty 15

## 2016-03-28 MED ORDER — EPINEPHRINE PF 1 MG/ML IJ SOLN
INTRAMUSCULAR | Status: AC
  Filled 2016-03-28: qty 1

## 2016-03-28 MED ORDER — DORZOLAMIDE HCL 2 % OP SOLN
1.0000 [drp] | Freq: Three times a day (TID) | OPHTHALMIC | Status: DC
  Filled 2016-03-28: qty 10

## 2016-03-28 MED ORDER — SERTRALINE HCL 50 MG PO TABS
25.0000 mg | ORAL_TABLET | Freq: Every day | ORAL | Status: DC
  Administered 2016-03-28: 25 mg via ORAL
  Filled 2016-03-28: qty 1

## 2016-03-28 MED ORDER — PHENYLEPHRINE 40 MCG/ML (10ML) SYRINGE FOR IV PUSH (FOR BLOOD PRESSURE SUPPORT)
PREFILLED_SYRINGE | INTRAVENOUS | Status: AC
  Filled 2016-03-28: qty 10

## 2016-03-28 MED ORDER — VITAMIN B-12 1000 MCG PO TABS
1000.0000 ug | ORAL_TABLET | Freq: Every day | ORAL | Status: DC
  Administered 2016-03-28: 1000 ug via ORAL
  Filled 2016-03-28: qty 1

## 2016-03-28 MED ORDER — BSS PLUS IO SOLN
INTRAOCULAR | Status: AC
  Filled 2016-03-28: qty 500

## 2016-03-28 MED ORDER — ACETAZOLAMIDE SODIUM 500 MG IJ SOLR
500.0000 mg | Freq: Once | INTRAMUSCULAR | Status: AC
  Administered 2016-03-29: 500 mg via INTRAVENOUS
  Filled 2016-03-28: qty 500

## 2016-03-28 MED ORDER — BACITRACIN-POLYMYXIN B 500-10000 UNIT/GM OP OINT
TOPICAL_OINTMENT | OPHTHALMIC | Status: DC | PRN
  Administered 2016-03-28: 1 via OPHTHALMIC

## 2016-03-28 MED ORDER — EPHEDRINE 5 MG/ML INJ
INTRAVENOUS | Status: AC
  Filled 2016-03-28: qty 10

## 2016-03-28 MED ORDER — GATIFLOXACIN 0.5 % OP SOLN
1.0000 [drp] | OPHTHALMIC | Status: AC | PRN
  Administered 2016-03-28 (×3): 1 [drp] via OPHTHALMIC

## 2016-03-28 MED ORDER — 0.9 % SODIUM CHLORIDE (POUR BTL) OPTIME
TOPICAL | Status: DC | PRN
  Administered 2016-03-28: 200 mL

## 2016-03-28 MED ORDER — PHENYLEPHRINE HCL 2.5 % OP SOLN
OPHTHALMIC | Status: AC
  Administered 2016-03-28: 1 [drp] via OPHTHALMIC
  Filled 2016-03-28: qty 2

## 2016-03-28 MED ORDER — PREDNISOLONE ACETATE 1 % OP SUSP
1.0000 [drp] | Freq: Four times a day (QID) | OPHTHALMIC | Status: DC
  Filled 2016-03-28: qty 5

## 2016-03-28 MED ORDER — ROCURONIUM BROMIDE 10 MG/ML (PF) SYRINGE
PREFILLED_SYRINGE | INTRAVENOUS | Status: AC
  Filled 2016-03-28: qty 10

## 2016-03-28 MED ORDER — STERILE WATER FOR INJECTION IJ SOLN
INTRAMUSCULAR | Status: DC | PRN
  Administered 2016-03-28: 20 mL

## 2016-03-28 MED ORDER — BRIMONIDINE TARTRATE 0.15 % OP SOLN: 1.0000 [drp] | Freq: Two times a day (BID) | OPHTHALMIC | Status: DC

## 2016-03-28 MED ORDER — MIDAZOLAM HCL 5 MG/5ML IJ SOLN
INTRAMUSCULAR | Status: DC | PRN
  Administered 2016-03-28: 2 mg via INTRAVENOUS

## 2016-03-28 MED ORDER — CLINDAMYCIN PHOSPHATE 600 MG/50ML IV SOLN
600.0000 mg | INTRAVENOUS | Status: DC | PRN
  Administered 2016-03-28: 600 mg via INTRAVENOUS
  Filled 2016-03-28: qty 50

## 2016-03-28 MED ORDER — BRIMONIDINE TARTRATE 0.2 % OP SOLN
1.0000 [drp] | Freq: Two times a day (BID) | OPHTHALMIC | Status: DC
  Filled 2016-03-28: qty 5

## 2016-03-28 MED ORDER — SODIUM CHLORIDE 0.45 % IV SOLN: INTRAVENOUS | Status: DC

## 2016-03-28 MED ORDER — SODIUM HYALURONATE 10 MG/ML IO SOLN
INTRAOCULAR | Status: AC
  Filled 2016-03-28: qty 0.85

## 2016-03-28 MED ORDER — SODIUM HYALURONATE 10 MG/ML IO SOLN
INTRAOCULAR | Status: DC | PRN
  Administered 2016-03-28: 0.85 mL via INTRAOCULAR

## 2016-03-28 MED ORDER — DEXAMETHASONE SODIUM PHOSPHATE 10 MG/ML IJ SOLN
INTRAMUSCULAR | Status: AC
  Filled 2016-03-28: qty 1

## 2016-03-28 MED ORDER — DEXAMETHASONE SODIUM PHOSPHATE 10 MG/ML IJ SOLN
INTRAMUSCULAR | Status: DC | PRN
  Administered 2016-03-28: 10 mg

## 2016-03-28 MED ORDER — ROCURONIUM BROMIDE 10 MG/ML (PF) SYRINGE
PREFILLED_SYRINGE | INTRAVENOUS | Status: DC | PRN
  Administered 2016-03-28 (×2): 20 mg via INTRAVENOUS
  Administered 2016-03-28: 10 mg via INTRAVENOUS
  Administered 2016-03-28: 50 mg via INTRAVENOUS

## 2016-03-28 MED ORDER — PHENYLEPHRINE HCL 2.5 % OP SOLN: 1.0000 [drp] | OPHTHALMIC | Status: DC | PRN

## 2016-03-28 MED ORDER — LIDOCAINE 2% (20 MG/ML) 5 ML SYRINGE
INTRAMUSCULAR | Status: AC
  Filled 2016-03-28: qty 5

## 2016-03-28 MED ORDER — DIPHENHYDRAMINE HCL 25 MG PO CAPS: 25.0000 mg | ORAL_CAPSULE | Freq: Four times a day (QID) | ORAL | Status: DC | PRN

## 2016-03-28 MED ORDER — ONDANSETRON HCL 4 MG/2ML IJ SOLN
4.0000 mg | Freq: Four times a day (QID) | INTRAMUSCULAR | Status: DC
  Administered 2016-03-28 – 2016-03-29 (×2): 4 mg via INTRAVENOUS
  Filled 2016-03-28 (×2): qty 2

## 2016-03-28 MED ORDER — EPHEDRINE SULFATE-NACL 50-0.9 MG/10ML-% IV SOSY
PREFILLED_SYRINGE | INTRAVENOUS | Status: DC | PRN
  Administered 2016-03-28: 10 mg via INTRAVENOUS
  Administered 2016-03-28 (×5): 5 mg via INTRAVENOUS

## 2016-03-28 MED ORDER — PROPOFOL 10 MG/ML IV BOLUS
INTRAVENOUS | Status: DC | PRN
  Administered 2016-03-28: 40 mg via INTRAVENOUS
  Administered 2016-03-28: 200 mg via INTRAVENOUS

## 2016-03-28 MED ORDER — TRIAMCINOLONE ACETONIDE 40 MG/ML IJ SUSP
INTRAMUSCULAR | Status: AC
  Filled 2016-03-28: qty 5

## 2016-03-28 MED ORDER — CEFTAZIDIME 1 G IJ SOLR
INTRAMUSCULAR | Status: AC
  Filled 2016-03-28: qty 1

## 2016-03-28 MED ORDER — GATIFLOXACIN 0.5 % OP SOLN
OPHTHALMIC | Status: AC
  Administered 2016-03-28: 1 [drp] via OPHTHALMIC
  Filled 2016-03-28: qty 2.5

## 2016-03-28 MED ORDER — POLYMYXIN B SULFATE 500000 UNITS IJ SOLR
INTRAMUSCULAR | Status: AC
  Filled 2016-03-28: qty 500000

## 2016-03-28 MED ORDER — LACTATED RINGERS IV SOLN
INTRAVENOUS | Status: DC | PRN
  Administered 2016-03-28 (×2): via INTRAVENOUS

## 2016-03-28 MED ORDER — VITAMIN B-12 100 MCG PO TABS
1000.0000 ug | ORAL_TABLET | Freq: Every day | ORAL | Status: DC
  Filled 2016-03-28: qty 10

## 2016-03-28 MED ORDER — MORPHINE SULFATE (PF) 2 MG/ML IV SOLN
1.0000 mg | INTRAVENOUS | Status: AC | PRN
  Administered 2016-03-28 – 2016-03-29 (×2): 2 mg via INTRAVENOUS
  Filled 2016-03-28 (×2): qty 1

## 2016-03-28 MED ORDER — SODIUM CHLORIDE 0.9 % IJ SOLN
INTRAMUSCULAR | Status: AC
  Filled 2016-03-28: qty 10

## 2016-03-28 SURGICAL SUPPLY — 64 items
APPLICATOR DR MATTHEWS STRL (MISCELLANEOUS) IMPLANT
BLADE EYE CATARACT 19 1.4 BEAV (BLADE) IMPLANT
BLADE KERATOME 2.75 (BLADE) ×2 IMPLANT
CANNULA VLV SOFT TIP 25GA (OPHTHALMIC) ×2 IMPLANT
CORDS BIPOLAR (ELECTRODE) IMPLANT
COTTONBALL LRG STERILE PKG (GAUZE/BANDAGES/DRESSINGS) ×6 IMPLANT
COVER MAYO STAND STRL (DRAPES) IMPLANT
DRAPE INCISE 51X51 W/FILM STRL (DRAPES) IMPLANT
DRAPE OPHTHALMIC 77X100 STRL (CUSTOM PROCEDURE TRAY) ×2 IMPLANT
FILTER BLUE MILLIPORE (MISCELLANEOUS) IMPLANT
FORCEPS ECKARDT ILM 25G SERR (OPHTHALMIC RELATED) IMPLANT
FORCEPS GRIESHABER ILM 25G A (INSTRUMENTS) ×2 IMPLANT
FORCEPS HORIZONTAL 25G DISP (OPHTHALMIC RELATED) IMPLANT
GAS OPHTHALMIC (MISCELLANEOUS) IMPLANT
GLOVE BIO SURGEON STRL SZ8 (GLOVE) ×2 IMPLANT
GLOVE SS BIOGEL STRL SZ 6.5 (GLOVE) ×1 IMPLANT
GLOVE SS BIOGEL STRL SZ 7 (GLOVE) ×1 IMPLANT
GLOVE SUPERSENSE BIOGEL SZ 6.5 (GLOVE) ×1
GLOVE SUPERSENSE BIOGEL SZ 7 (GLOVE) ×1
GLOVE SURG 8.5 LATEX PF (GLOVE) ×2 IMPLANT
GLOVE SURG SS PI 6.5 STRL IVOR (GLOVE) ×4 IMPLANT
GOWN STRL REUS W/ TWL LRG LVL3 (GOWN DISPOSABLE) ×4 IMPLANT
GOWN STRL REUS W/ TWL XL LVL3 (GOWN DISPOSABLE) ×1 IMPLANT
GOWN STRL REUS W/TWL LRG LVL3 (GOWN DISPOSABLE) ×4
GOWN STRL REUS W/TWL XL LVL3 (GOWN DISPOSABLE) ×1
HANDLE PNEUMATIC FOR CONSTEL (OPHTHALMIC) ×2 IMPLANT
KIT BASIN OR (CUSTOM PROCEDURE TRAY) ×2 IMPLANT
KIT ROOM TURNOVER OR (KITS) ×2 IMPLANT
KNIFE CRESCENT 1.75 EDGEAHEAD (BLADE) IMPLANT
LENS IOL POST 1PIECE DIOP 15.0 (Intraocular Lens) ×2 IMPLANT
MICROPICK 25G (MISCELLANEOUS)
NEEDLE 18GX1X1/2 (RX/OR ONLY) (NEEDLE) ×2 IMPLANT
NEEDLE 25GX 5/8IN NON SAFETY (NEEDLE) ×2 IMPLANT
NEEDLE 27GX1/2 REG BEVEL ECLIP (NEEDLE) ×2 IMPLANT
NEEDLE FILTER BLUNT 18X 1/2SAF (NEEDLE) ×1
NEEDLE FILTER BLUNT 18X1 1/2 (NEEDLE) ×1 IMPLANT
NEEDLE HYPO 30X.5 LL (NEEDLE) IMPLANT
NS IRRIG 1000ML POUR BTL (IV SOLUTION) ×2 IMPLANT
PACK VITRECTOMY CUSTOM (CUSTOM PROCEDURE TRAY) ×2 IMPLANT
PAD ARMBOARD 7.5X6 YLW CONV (MISCELLANEOUS) ×2 IMPLANT
PAK PIK VITRECTOMY CVS 25GA (OPHTHALMIC) ×2 IMPLANT
PENCIL BIPOLAR 25GA STR DISP (OPHTHALMIC RELATED) IMPLANT
PIC ILLUMINATED 25G (OPHTHALMIC) ×2
PICK MICROPICK 25G (MISCELLANEOUS) IMPLANT
PIK ILLUMINATED 25G (OPHTHALMIC) ×1 IMPLANT
PROBE LASER ILLUM FLEX CVD 25G (OPHTHALMIC) IMPLANT
ROLLS DENTAL (MISCELLANEOUS) ×4 IMPLANT
SCRAPER DIAMOND 25GA (OPHTHALMIC RELATED) IMPLANT
SPONGE SURGIFOAM ABS GEL 12-7 (HEMOSTASIS) ×2 IMPLANT
STOPCOCK 4 WAY LG BORE MALE ST (IV SETS) IMPLANT
SUT CHROMIC 7 0 TG140 8 (SUTURE) ×2 IMPLANT
SUT ETHILON 10 0 CS140 6 (SUTURE) ×2 IMPLANT
SUT ETHILON 9 0 TG140 8 (SUTURE) IMPLANT
SUT POLY NON ABSORB 10-0 8 STR (SUTURE) ×4 IMPLANT
SUT SILK 4 0 RB 1 (SUTURE) IMPLANT
SYR 20CC LL (SYRINGE) ×2 IMPLANT
SYR 5ML LL (SYRINGE) IMPLANT
SYR BULB 3OZ (MISCELLANEOUS) ×2 IMPLANT
SYR TB 1ML LUER SLIP (SYRINGE) ×2 IMPLANT
SYRINGE 10CC LL (SYRINGE) ×2 IMPLANT
TAPE SURG TRANSPORE 1 IN (GAUZE/BANDAGES/DRESSINGS) ×1 IMPLANT
TAPE SURGICAL TRANSPORE 1 IN (GAUZE/BANDAGES/DRESSINGS) ×1
TUBING HIGH PRESS EXTEN 6IN (TUBING) IMPLANT
WATER STERILE IRR 1000ML POUR (IV SOLUTION) ×2 IMPLANT

## 2016-03-28 NOTE — Transfer of Care (Signed)
Immediate Anesthesia Transfer of Care Note  Patient: [name]  Procedure(s) Performed: Procedure(s): PARS PLANA VITRECTOMY WITH 25G REMOVAL/SUTURE INTRAOCULAR LENS (Left) AIR/FLUID EXCHANGE (Left)  Patient Location: PACU  Anesthesia Type:General  Level of Consciousness: awake, alert , oriented and patient cooperative  Airway & Oxygen Therapy: Patient Spontanous Breathing and Patient connected to nasal cannula oxygen  Post-op Assessment: Report given to RN and Post -op Vital signs reviewed and stable  Post vital signs: Reviewed and stable  Last Vitals:  Vitals:   03/28/16 0940  BP: 124/70  Pulse: (!) 58  Resp: 20  Temp: 37.1 C    Last Pain:  Vitals:   03/28/16 0940  TempSrc: Oral      Patients Stated Pain Goal: 5 ([id number])  Complications: No apparent anesthesia complications

## 2016-03-28 NOTE — Anesthesia Procedure Notes (Addendum)
Procedure Name: Intubation Date/Time: 03/28/2016 11:50 AM Performed by: Everlean Cherry A Pre-anesthesia Checklist: Emergency Drugs available, Patient identified, Suction available and Patient being monitored Patient Re-evaluated:Patient Re-evaluated prior to inductionOxygen Delivery Method: Circle system utilized Preoxygenation: Pre-oxygenation with 100% oxygen Intubation Type: IV induction Ventilation: Mask ventilation without difficulty Laryngoscope Size: Miller and 2 Grade View: Grade II Tube type: Oral Tube size: 7.5 mm Number of attempts: 1 Airway Equipment and Method: Stylet Placement Confirmation: ETT inserted through vocal cords under direct vision,  positive ETCO2 and breath sounds checked- equal and bilateral Secured at: 24 cm Tube secured with: Tape Dental Injury: Teeth and Oropharynx as per pre-operative assessment

## 2016-03-28 NOTE — Progress Notes (Signed)
[name] is a 60 y.o. male patient admitted from ED awake, alert - oriented  X 4 - no acute distress noted.  VSS - Blood pressure 124/83, pulse 73, temperature 98 F (36.7 C), resp. rate 18, SpO2 99 %.    IV in place, occlusive dsg intact without redness.  Orientation to room, and floor completed.  Admission INP armband ID verified with patient/family, and in place.   SR up x 2, fall assessment complete, with patient and family able to verbalize understanding of risk associated with falls, and verbalized understanding to call nsg before up out of bed.  Call light within reach, patient able to voice, and demonstrate understanding. No evidence of skin break down noted on exam.  Admission nurse notified of admission.     Will cont to eval and treat per MD orders.  Theora Master, RN 03/28/2016 2:47 PM

## 2016-03-28 NOTE — Brief Op Note (Signed)
03/28/2016  1:54 PM  PATIENT:  [name]  60 y.o. male  PRE-OPERATIVE DIAGNOSIS:  dislocated IOL left eye  POST-OPERATIVE DIAGNOSIS:  dislocated IOL left eye  PROCEDURE:  Procedure(s): PARS PLANA VITRECTOMY WITH 25G REMOVAL/SUTURE INTRAOCULAR LENS (Left) AIR/FLUID EXCHANGE (Left)  SURGEON:  Surgeon(s) and Role:    * Hayden Pedro, MD - Primary  Brief Operative note   Preoperative diagnosis:  dislocated IOL left eye Postoperative diagnosis  * No Diagnosis Codes entered *  Procedures: Pars plana vitrectomy, laser, gas injection. Removal of lens from vitreous, placement of secondary intraocular lens with suture, left eye.  Surgeon:  Hayden Pedro, MD...  Assistant:  Deatra Ina SA    Anesthesia: General  Specimen: none  Estimated blood loss:  1cc  Complications: none  Patient sent to PACU in good condition  Composed by Hayden Pedro MD  Dictation number:

## 2016-03-28 NOTE — Anesthesia Preprocedure Evaluation (Addendum)
Anesthesia Evaluation  Patient identified by MRN, date of birth, ID band Patient awake    Reviewed: Allergy & Precautions, NPO status , Patient's Chart, lab work & pertinent test results  History of Anesthesia Complications Negative for: history of anesthetic complications  Airway Mallampati: I  TM Distance: >3 FB Neck ROM: Full    Dental  (+) Dental Advisory Given   Pulmonary sleep apnea (no longer requires CPAP) ,    breath sounds clear to auscultation       Cardiovascular (-) hypertensionnegative cardio ROS   Rhythm:Regular Rate:Normal     Neuro/Psych negative neurological ROS     GI/Hepatic Neg liver ROS, GERD  Medicated and Controlled,  Endo/Other  negative endocrine ROS  Renal/GU negative Renal ROS     Musculoskeletal negative musculoskeletal ROS (+)   Abdominal   Peds  Hematology negative hematology ROS (+)   Anesthesia Other Findings   Reproductive/Obstetrics                            Anesthesia Physical Anesthesia Plan  ASA: II  Anesthesia Plan: General   Post-op Pain Management:    Induction: Intravenous  Airway Management Planned: Oral ETT  Additional Equipment:   Intra-op Plan:   Post-operative Plan: Extubation in OR  Informed Consent: I have reviewed the patients History and Physical, chart, labs and discussed the procedure including the risks, benefits and alternatives for the proposed anesthesia with the patient or authorized representative who has indicated his/her understanding and acceptance.   Dental advisory given  Plan Discussed with: CRNA and Surgeon  Anesthesia Plan Comments: (Plan routine monitors, GETA)        Anesthesia Quick Evaluation

## 2016-03-28 NOTE — Anesthesia Postprocedure Evaluation (Signed)
Anesthesia Post Note  Patient: [name]  Procedure(s) Performed: Procedure(s) (LRB): PARS PLANA VITRECTOMY WITH 25G REMOVAL/SUTURE INTRAOCULAR LENS (Left) AIR/FLUID EXCHANGE (Left)  Patient location during evaluation: PACU Anesthesia Type: General Level of consciousness: awake and alert, oriented and patient cooperative Pain management: pain level controlled (pain improving) Vital Signs Assessment: post-procedure vital signs reviewed and stable Respiratory status: spontaneous breathing, nonlabored ventilation and respiratory function stable Cardiovascular status: blood pressure returned to baseline and stable Postop Assessment: no signs of nausea or vomiting Anesthetic complications: no    Last Vitals:  Vitals:   03/28/16 1426 03/28/16 1447  BP: 124/83 130/76  Pulse: 73 62  Resp: 18 18  Temp: 36.7 C 36.7 C    Last Pain:  Vitals:   03/28/16 1458  TempSrc:   PainSc: 8                  Phoebie Shad,E. Naiara Lombardozzi

## 2016-03-28 NOTE — H&P (Signed)
I examined the patient today and there is no change in the medical status 

## 2016-03-29 DIAGNOSIS — H401320 Pigmentary glaucoma, left eye, stage unspecified: Secondary | ICD-10-CM | POA: Diagnosis not present

## 2016-03-29 MED ORDER — BACITRACIN-POLYMYXIN B 500-10000 UNIT/GM OP OINT: 1.0000 "application " | TOPICAL_OINTMENT | Freq: Three times a day (TID) | OPHTHALMIC | 0 refills | Status: DC

## 2016-03-29 MED ORDER — HYDROCODONE-ACETAMINOPHEN 5-325 MG PO TABS: 1.0000 | ORAL_TABLET | ORAL | 0 refills | Status: DC | PRN

## 2016-03-29 MED ORDER — BRIMONIDINE TARTRATE 0.2 % OP SOLN
1.0000 [drp] | Freq: Two times a day (BID) | OPHTHALMIC | 12 refills | Status: DC
Start: 2016-03-29 — End: 2020-03-04

## 2016-03-29 MED ORDER — PREDNISOLONE ACETATE 1 % OP SUSP: 1.0000 [drp] | Freq: Four times a day (QID) | OPHTHALMIC | 0 refills | Status: DC

## 2016-03-29 MED ORDER — GATIFLOXACIN 0.5 % OP SOLN: 1.0000 [drp] | Freq: Four times a day (QID) | OPHTHALMIC | Status: DC

## 2016-03-29 NOTE — Op Note (Signed)
[name], [name]             ACCOUNT NO.:  [id number]  MEDICAL RECORD NO.:  [id number]  LOCATION:  [id number]                        FACILITY:  [facility]  PHYSICIAN:  Chrystie Nose. Zigmund Daniel, M.D. 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  PROCEDURES:  Pars plana vitrectomy, retinal photocoagulation, removal of intraocular lens from the posterior chamber and vitreous, placement of secondary intraocular lens with suture; left eye.  SURGEON:  Chrystie Nose. Zigmund Daniel, M.D.  ASSISTANT:  Deatra Ina, SA.  ANESTHESIA:  General.  DETAILS:  Usual prep and drape.  The indirect ophthalmoscope laser was moved into place.  1216 burns were placed around the retinal periphery. These burns were primarily placed around an area of rhegmatogenous retinal detachment, which had been walled off.  The power was between 405 and 100 mW 1000 microns each and 0.1 seconds each.  Conjunctival peritomy from 8 o'clock to 4 o'clock.  Half thickness scleral flaps were raised at 3 o'clock and 9 o'clock in anticipation of IOL suture.  A 3- layered corneoscleral wound was created from 10 o'clock to 2 o'clock.  A 25-gauge trocars were placed at 10, 2, and 4 o'clock with infusion at 4 o'clock.  Pars plana vitrectomy was begun just behind the pseudophakos and capsular remnants.  The pseudophakos was tilted and partially in the vitreous.  The vitrector was used to free the intra-ocular lens from its attachment to the vitreous strands.  The vitrectomy was carried posteriorly and a core vitrectomy was performed.  The BIOM viewing system was moved into place.  The vitrectomy was carried into the mid periphery and then into the far periphery.  The vitreous was trimmed down to the vitreous base.  The corneoscleral wound was opened with keratome.  The  lens was passed from vitreous into the posterior chamber, into the anterior chamber, and then dialed out of the wound with Sinskey hook.  Additional vitrectomy was carried out at this point removing pigment from the iris and from the anterior segment structures.  2 Prolene sutures were placed from 3 o'clock to 9 o'clock beneath the scleral flaps, posterior to the iris and anterior to the capsular remnants.  A docking needle was used for this maneuver.  The Prolenes were pulled out of the corneoscleral wound with 20-gauge vitreous forceps.  A new intraocular lens was brought onto the field.  It was made by [employer], model CG70BD, power 15.0 D, length 12.5 mm, optic 7.0 mm, serial [id number], expiration date October of 2018, was brought onto the field, inspected, and cleaned.  The Prolene sutures were attached to the eyelets of the lens.  The lens was passed through the corneoscleral wound into the anterior chamber, then into the posterior chamber.  Some small capsular remnants remained.  These were positioned behind the intra-ocular lens.  The lens was dialed into place.  Prolene sutures were knotted securely and the free ends removed. The scleral flaps were allowed to cover these Prolene knots.  The corneal suture was closed with 6 interrupted 10-0 nylon  sutures.  The wound was tested and found to be secure.  Additional vitrectomy, at this point, was used to remove some blood from the retinal surface and the pigment granules from the retinal surface.  A 30% gas-fluid exchange was carried out.  The instruments were removed from the eye.  The 25-gauge trocars were removed from the eye.  The wounds were found to be secure and the corneoscleral wound was found to be secure.  The conjunctiva was reposited with 7-0 chromic suture.  Polymyxin and ceftazidime were injected around the globe for antibiotic coverage.  Decadron 10 mg was injected into the lower subconjunctival space  and Marcaine solution was injected around the globe for postop pain.  Closing pressure was 10 with a Barraquer tonometer. Polysporin ophthalmic ointment and a patch and shield were placed.  The patient was awakened and taken to recovery in satisfactory condition.  COMPLICATIONS:  None.  DURATION:  2 hours.     Chrystie Nose. Zigmund Daniel, M.D.     JDM/MEDQ  D:  03/28/2016  T:  03/29/2016  Job:  [id number]

## 2016-03-29 NOTE — Progress Notes (Signed)
Discharge home. Home discharge instruction given by previous nurse. No questions verbalized

## 2016-03-29 NOTE — Discharge Summary (Signed)
Discharge summary not needed on [facility] patients per medical records. 

## 2016-03-29 NOTE — Progress Notes (Signed)
03/29/2016, 6:33 AM  Mental Status:  Awake, Alert, Oriented  Anterior segment: Cornea  Clear    Anterior Chamber Clear    Lens:   Clear, IOL in place   Intra Ocular Pressure 5 mmHg with Tonopen  Vitreous: Clear 20%gas bubble   Retina:  Attached Good laser reaction   Impression: Excellent result Retina attached   Final Diagnosis: Active Problems:   Posterior dislocation of lens   Plan: start post operative eye drops.  Discharge to home.  Give post operative instructions  [name] 03/29/2016, 6:33 AM

## 2016-03-30 ENCOUNTER — Encounter (HOSPITAL_COMMUNITY): Payer: Self-pay | Admitting: Ophthalmology

## 2016-03-30 NOTE — Progress Notes (Signed)
Buford Dresser to be D/C'd  per MD order. Discussed with the patient and all questions fully answered.  VSS, Skin clean, dry and intact without evidence of skin break down, no evidence of skin tears noted.  IV catheter discontinued intact. Site without signs and symptoms of complications. Dressing and pressure applied.  An After Visit Summary was printed and given to the patient. Patient received prescription.  D/c education completed with patient/family including follow up instructions, medication list, d/c activities limitations if indicated, with other d/c instructions as indicated by MD - patient able to verbalize understanding, all questions fully answered.   Patient instructed to return to ED, call 911, or call MD for any changes in condition.   Patient to be escorted via [place], and D/C home via private auto.

## 2016-04-04 ENCOUNTER — Inpatient Hospital Stay (INDEPENDENT_AMBULATORY_CARE_PROVIDER_SITE_OTHER): Payer: 59 | Admitting: Ophthalmology

## 2016-04-04 DIAGNOSIS — H2702 Aphakia, left eye: Secondary | ICD-10-CM

## 2016-04-19 ENCOUNTER — Ambulatory Visit (INDEPENDENT_AMBULATORY_CARE_PROVIDER_SITE_OTHER): Payer: 59 | Admitting: Ophthalmology

## 2016-04-19 DIAGNOSIS — H2702 Aphakia, left eye: Secondary | ICD-10-CM

## 2016-06-28 ENCOUNTER — Encounter (INDEPENDENT_AMBULATORY_CARE_PROVIDER_SITE_OTHER): Payer: 59 | Admitting: Ophthalmology

## 2016-06-28 DIAGNOSIS — H43811 Vitreous degeneration, right eye: Secondary | ICD-10-CM

## 2016-06-28 DIAGNOSIS — H338 Other retinal detachments: Secondary | ICD-10-CM | POA: Diagnosis not present

## 2016-06-28 DIAGNOSIS — H2703 Aphakia, bilateral: Secondary | ICD-10-CM

## 2017-11-28 ENCOUNTER — Other Ambulatory Visit: Payer: Self-pay | Admitting: Internal Medicine

## 2017-11-28 DIAGNOSIS — M5412 Radiculopathy, cervical region: Secondary | ICD-10-CM | POA: Insufficient documentation

## 2017-12-21 ENCOUNTER — Other Ambulatory Visit: Payer: 59

## 2018-03-26 ENCOUNTER — Ambulatory Visit: Payer: Managed Care, Other (non HMO) | Admitting: Podiatry

## 2018-03-26 ENCOUNTER — Other Ambulatory Visit: Payer: Self-pay | Admitting: Podiatry

## 2018-03-26 ENCOUNTER — Ambulatory Visit (INDEPENDENT_AMBULATORY_CARE_PROVIDER_SITE_OTHER): Payer: Managed Care, Other (non HMO)

## 2018-03-26 ENCOUNTER — Encounter: Payer: Self-pay | Admitting: Podiatry

## 2018-03-26 VITALS — BP 143/99 | HR 64

## 2018-03-26 DIAGNOSIS — Z79899 Other long term (current) drug therapy: Secondary | ICD-10-CM | POA: Diagnosis not present

## 2018-03-26 DIAGNOSIS — B351 Tinea unguium: Secondary | ICD-10-CM | POA: Diagnosis not present

## 2018-03-26 DIAGNOSIS — M778 Other enthesopathies, not elsewhere classified: Secondary | ICD-10-CM

## 2018-03-26 DIAGNOSIS — M779 Enthesopathy, unspecified: Secondary | ICD-10-CM

## 2018-03-26 DIAGNOSIS — M79672 Pain in left foot: Secondary | ICD-10-CM

## 2018-03-26 MED ORDER — TERBINAFINE HCL 250 MG PO TABS: 250.0000 mg | ORAL_TABLET | Freq: Every day | ORAL | 0 refills | Status: DC

## 2018-03-27 NOTE — Progress Notes (Signed)
   Subjective: 62 year old male presenting today with a chief complaint of recurrent fungal nails of bilateral feet. He states he was treated for fungal nails a few years ago by Dr. Milinda Pointer but the symptoms have now returned. He reports associated discoloration and thickening of the nails. He denies modifying factors and has not had any recent treatment.  He also complains of sharp pain to the dorsal left foot that began 2-3 months ago. Walking and stretching increases his pain. He has not had any treatment. Patient is here for further evaluation and treatment.   Past Medical History:  Diagnosis Date  . Arthritis    "right index" (03/28/2016)  . B12 deficiency   . Basal cell carcinoma of right side of nose   . Basal cell carcinoma of skin of nose    top of bridge  . Chronic back pain    "lower back and cervical spine" (03/28/2016)  . Depression    "stress related" (03/18/2016)  . GERD (gastroesophageal reflux disease)   . History of kidney stones   . Iron deficiency anemia   . Normocytic anemia   . OSA (obstructive sleep apnea)    no cpap after weight loss (03/28/2016)  . Seasonal allergies   . Ventral hernia     Objective: Physical Exam General: The patient is alert and oriented x3 in no acute distress.  Dermatology: Hyperkeratotic, discolored, thickened, onychodystrophy of nails noted bilaterally. Skin is warm, dry and supple bilateral lower extremities. Negative for open lesions or macerations.  Vascular: Palpable pedal pulses bilaterally. No edema or erythema noted. Capillary refill within normal limits.  Neurological: Epicritic and protective threshold grossly intact bilaterally.   Musculoskeletal Exam: Range of motion within normal limits to all pedal and ankle joints bilateral. Muscle strength 5/5 in all groups bilateral.   Radiographic Exam:  Normal osseous mineralization. Joint spaces preserved. No fracture/dislocation/boney destruction.    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 X-Rays reviewed.  #2 Orders for liver function tests were placed today.  #3 Prescription for Lamisil 250 mg #90 provided to patient.  #4 Injection of 0.5 mLs Celestone Soluspan injected into the left midfoot.  #5 Return to clinic as needed.    Edrick Kins, DPM [facility]  Dr. Edrick Kins, [address]                                        [place], [place] [address]                Office [phone]  Fax [phone]

## 2018-03-30 LAB — HEPATIC FUNCTION PANEL
ALT: 25 IU/L (ref 0–44)
AST: 26 IU/L (ref 0–40)
Albumin: 4.4 g/dL (ref 3.6–4.8)
Alkaline Phosphatase: 50 IU/L (ref 39–117)
Bilirubin Total: 0.5 mg/dL (ref 0.0–1.2)
Bilirubin, Direct: 0.14 mg/dL (ref 0.00–0.40)
Total Protein: 6.4 g/dL (ref 6.0–8.5)

## 2019-09-16 ENCOUNTER — Ambulatory Visit (INDEPENDENT_AMBULATORY_CARE_PROVIDER_SITE_OTHER): Payer: Managed Care, Other (non HMO) | Admitting: Podiatry

## 2019-09-16 ENCOUNTER — Encounter: Payer: Self-pay | Admitting: Podiatry

## 2019-09-16 ENCOUNTER — Ambulatory Visit (INDEPENDENT_AMBULATORY_CARE_PROVIDER_SITE_OTHER): Payer: Managed Care, Other (non HMO)

## 2019-09-16 ENCOUNTER — Other Ambulatory Visit: Payer: Self-pay

## 2019-09-16 DIAGNOSIS — M778 Other enthesopathies, not elsewhere classified: Secondary | ICD-10-CM

## 2019-09-16 DIAGNOSIS — M19079 Primary osteoarthritis, unspecified ankle and foot: Secondary | ICD-10-CM | POA: Diagnosis not present

## 2019-09-16 NOTE — Progress Notes (Signed)
Subjective:  Patient ID: [name], male    DOB: March 02, 1956,  MRN: [id number]  Chief Complaint  Patient presents with  . Foot Pain    pt is here for left foot pain, located to the lateral side of the left foot, pt states that the left foot pain is elevated in the morning, and puts pain on a scale of 8 out of 7.    64 y.o. male presents with the above complaint.  Patient presents with complaint of left lateral foot pain that has been going on for multiple weeks has progressive gotten a lot worse on.  Patient states is painful to walk on.  The pain is sporadic.  It is aching shooting throbbing in nature.  Is worse in the morning pain scale is 8 out of 10.  He has made some shoe gear modification which helps a little bit.  But he would like to know if there is any other treatment options to take some of the pain away.  He denies any other acute complaints.   Review of Systems: Negative except as noted in the HPI. Denies N/V/F/Ch.  Past Medical History:  Diagnosis Date  . Arthritis    "right index" (03/28/2016)  . B12 deficiency   . Basal cell carcinoma of right side of nose   . Basal cell carcinoma of skin of nose    top of bridge  . Chronic back pain    "lower back and cervical spine" (03/28/2016)  . Depression    "stress related" (03/18/2016)  . GERD (gastroesophageal reflux disease)   . History of kidney stones   . Iron deficiency anemia   . Normocytic anemia   . OSA (obstructive sleep apnea)    no cpap after weight loss (03/28/2016)  . Seasonal allergies   . Ventral hernia     Current Outpatient Medications:  .  bacitracin-polymyxin b (POLYSPORIN) ophthalmic ointment, Place 1 application into the left eye 3 (three) times daily. apply to eye every 12 hours while awake, Disp: 3.5 g, Rfl: 0 .  brimonidine (ALPHAGAN) 0.15 % ophthalmic solution, Place 1 drop into the left eye 2 (two) times daily., Disp: , Rfl:  .  brimonidine (ALPHAGAN) 0.2 % ophthalmic solution, Place 1  drop into the left eye 2 (two) times daily., Disp: 5 mL, Rfl: 12 .  Cyanocobalamin (RA VITAMIN B-12 TR) 1000 MCG TBCR, Take 1,000 mcg by mouth daily. , Disp: , Rfl:  .  cyclobenzaprine (FLEXERIL) 10 MG tablet, , Disp: , Rfl: 3 .  diphenhydrAMINE (BENADRYL) 25 mg capsule, Take 25 mg by mouth every 6 (six) hours as needed for allergies., Disp: , Rfl:  .  etodolac (LODINE) 400 MG tablet, Take 400 mg by mouth 2 (two) times daily., Disp: , Rfl:  .  ferrous sulfate 325 (65 FE) MG EC tablet, Take 325 mg by mouth daily. , Disp: , Rfl:  .  gatifloxacin (ZYMAXID) 0.5 % SOLN, Place 1 drop into the left eye 4 (four) times daily., Disp: , Rfl:  .  HYDROcodone-acetaminophen (NORCO/VICODIN) 5-325 MG tablet, Take 1-2 tablets by mouth every 4 (four) hours as needed for moderate pain., Disp: 30 tablet, Rfl: 0 .  hydrocortisone 2.5 % cream, Apply 1 application topically at bedtime., Disp: , Rfl:  .  ketoconazole (NIZORAL) 2 % cream, Apply 1 application topically at bedtime., Disp: , Rfl:  .  Multiple Vitamin (MULTI-VITAMINS) TABS, Take 1 tablet by mouth daily. , Disp: , Rfl:  .  pantoprazole (PROTONIX)  40 MG tablet, Take 40 mg by mouth daily., Disp: , Rfl:  .  prednisoLONE acetate (PRED FORTE) 1 % ophthalmic suspension, Place 1 drop into the left eye 4 (four) times daily., Disp: 5 mL, Rfl: 0 .  sertraline (ZOLOFT) 25 MG tablet, Take 25 mg by mouth daily., Disp: , Rfl:  .  terbinafine (LAMISIL) 250 MG tablet, Take 1 tablet (250 mg total) by mouth daily., Disp: 90 tablet, Rfl: 0  Social History   Tobacco Use  Smoking Status Never Smoker  Smokeless Tobacco Never Used    Allergies  Allergen Reactions  . Penicillins Hives   Objective:  There were no vitals filed for this visit. There is no height or weight on file to calculate BMI. Constitutional Well developed. Well nourished.  Vascular Dorsalis pedis pulses palpable bilaterally. Posterior tibial pulses palpable bilaterally. Capillary refill normal to all  digits.  No cyanosis or clubbing noted. Pedal hair growth normal.  Neurologic Normal speech. Oriented to person, place, and time. Epicritic sensation to light touch grossly present bilaterally.  Dermatologic Nails well groomed and normal in appearance. No open wounds. No skin lesions.  Orthopedic:  Pain on palpation to the left lateral midfoot joints for tarsometatarsal joint.  Mild pain with range of motion of the fourth tarsometatarsal joint.  No pain with extensor or flexor tendons active and passive.  No pain at the fifth metatarsal base.  No pain along the course of the peroneal tendon.  No pain at the ATFL region.   Radiographs: 3 views of skeletally mature adult left foot: Mild osteoarthritic changes noted at the midfoot.  Plantar and posterior heel spurring noted.  No other bony abnormalities noted.  Assessment:   1. Arthritis of midfoot   2. Capsulitis of left foot    Plan:  Patient was evaluated and treated and all questions answered.  Left lateral midfoot arthritis with capsulitis -I explained to the patient the etiology of arthritis and various treatment options were extensively discussed.  I believe patient will benefit from a steroid injection to help decrease the acute inflammatory component associated with pain.  Patient agrees with the plan would like to proceed with a steroid injection. -A steroid injection was performed at left lateral midfoot using 1% plain Lidocaine and 10 mg of Kenalog. This was well tolerated.   No follow-ups on file.

## 2019-10-30 ENCOUNTER — Ambulatory Visit: Payer: Managed Care, Other (non HMO) | Admitting: Podiatry

## 2019-12-19 DIAGNOSIS — E559 Vitamin D deficiency, unspecified: Secondary | ICD-10-CM | POA: Insufficient documentation

## 2020-01-14 ENCOUNTER — Other Ambulatory Visit: Payer: Self-pay | Admitting: Unknown Physician Specialty

## 2020-01-14 ENCOUNTER — Other Ambulatory Visit (HOSPITAL_COMMUNITY): Payer: Self-pay | Admitting: Unknown Physician Specialty

## 2020-01-14 DIAGNOSIS — R221 Localized swelling, mass and lump, neck: Secondary | ICD-10-CM

## 2020-01-30 ENCOUNTER — Other Ambulatory Visit: Payer: Self-pay

## 2020-01-30 ENCOUNTER — Ambulatory Visit
Admission: RE | Admit: 2020-01-30 | Discharge: 2020-01-30 | Disposition: A | Discharge status: Home / Self Care | Payer: Managed Care, Other (non HMO) | Source: Ambulatory Visit | Attending: Unknown Physician Specialty | Admitting: Unknown Physician Specialty

## 2020-01-30 DIAGNOSIS — R221 Localized swelling, mass and lump, neck: Secondary | ICD-10-CM | POA: Diagnosis present

## 2020-01-30 LAB — POCT I-STAT CREATININE: Creatinine, Ser: 1 mg/dL (ref 0.61–1.24)

## 2020-01-30 MED ORDER — IOHEXOL 300 MG/ML  SOLN
80.0000 mL | Freq: Once | INTRAMUSCULAR | Status: AC | PRN
  Administered 2020-01-30: 80 mL via INTRAVENOUS

## 2020-02-04 ENCOUNTER — Other Ambulatory Visit: Payer: Self-pay | Admitting: Unknown Physician Specialty

## 2020-02-04 DIAGNOSIS — K118 Other diseases of salivary glands: Secondary | ICD-10-CM

## 2020-02-09 ENCOUNTER — Other Ambulatory Visit: Payer: Self-pay | Admitting: Radiology

## 2020-02-10 ENCOUNTER — Other Ambulatory Visit: Payer: Self-pay

## 2020-02-10 ENCOUNTER — Ambulatory Visit
Admission: RE | Admit: 2020-02-10 | Discharge: 2020-02-10 | Disposition: A | Discharge status: Home / Self Care | Payer: Managed Care, Other (non HMO) | Source: Ambulatory Visit | Attending: Unknown Physician Specialty | Admitting: Unknown Physician Specialty

## 2020-02-10 DIAGNOSIS — K118 Other diseases of salivary glands: Secondary | ICD-10-CM | POA: Insufficient documentation

## 2020-02-10 NOTE — Discharge Instructions (Signed)

## 2020-02-10 NOTE — Progress Notes (Signed)
Patient ID: [name], male   DOB: Feb 06, 1956, 64 y.o.   MRN: [id number] Patient presents to IR department today for image guided left parotid mass biopsy.  Details/risks of procedure, including but not limited to, internal bleeding, infection, injury to adjacent structures discussed with patient with his understanding and consent.  Procedure to be done with local anesthesia only.

## 2020-02-10 NOTE — Procedures (Signed)
Interventional Radiology Procedure Note  Procedure: Ultrasound guided left parotid mass biopsy  Findings: Please refer to procedural dictation for full description. 25 ga FNA attempted x3 without detectable aspirate, therefore 18 ga core biopsy x3 was performed.  Samples sent to Pathology.  Complications: None immeidate  Estimated Blood Loss: <10 mL  Recommendations: Follow up Pathology results.   Ruthann Cancer, MD Pager: [phone]

## 2020-02-11 LAB — CYTOLOGY - NON PAP

## 2020-02-11 LAB — SURGICAL PATHOLOGY

## 2020-02-16 ENCOUNTER — Other Ambulatory Visit: Payer: Self-pay | Admitting: Unknown Physician Specialty

## 2020-02-16 DIAGNOSIS — K118 Other diseases of salivary glands: Secondary | ICD-10-CM

## 2020-02-20 NOTE — OR Nursing (Signed)
Called pt to discuss return for additional tissue sample of parotid mass. Pt stated he would rather not get sedation. Discussed arrival time of 10 am 02/25/20, to be NPO if he decides he wants sedation.

## 2020-02-24 ENCOUNTER — Other Ambulatory Visit: Payer: Self-pay | Admitting: Radiology

## 2020-02-24 MED ORDER — SODIUM CHLORIDE 0.9 % IV SOLN: INTRAVENOUS | Status: DC

## 2020-02-25 ENCOUNTER — Ambulatory Visit
Admission: RE | Admit: 2020-02-25 | Discharge: 2020-02-25 | Disposition: A | Discharge status: Home / Self Care | Payer: Managed Care, Other (non HMO) | Source: Ambulatory Visit | Attending: Unknown Physician Specialty | Admitting: Unknown Physician Specialty

## 2020-02-25 ENCOUNTER — Other Ambulatory Visit: Payer: Self-pay

## 2020-02-25 DIAGNOSIS — C07 Malignant neoplasm of parotid gland: Secondary | ICD-10-CM | POA: Insufficient documentation

## 2020-02-25 DIAGNOSIS — K118 Other diseases of salivary glands: Secondary | ICD-10-CM | POA: Diagnosis present

## 2020-02-25 NOTE — Discharge Instructions (Signed)
Needle Biopsy, Care After This sheet gives you information about how to care for yourself after your procedure. Your health care provider may also give you more specific instructions. If you have problems or questions, contact your health care provider. What can I expect after the procedure? After the procedure, it is common to have soreness, bruising, or mild pain at the puncture site. This should go away in a few days. Follow these instructions at home: Needle insertion site care   Wash your hands with soap and water before you change your bandage (dressing). If you cannot use soap and water, use hand sanitizer.  Follow instructions from your health care provider about how to take care of your puncture site. This includes: ? When and how to change your dressing. ? When to remove your dressing.  Check your puncture site every day for signs of infection. Check for: ? Redness, swelling, or pain. ? Fluid or blood. ? Pus or a bad smell. ? Warmth. General instructions  Return to your normal activities as told by your health care provider. Ask your health care provider what activities are safe for you.  Do not take baths, swim, or use a hot tub until your health care provider approves. Ask your health care provider if you may take showers. You may only be allowed to take sponge baths.  Take over-the-counter and prescription medicines only as told by your health care provider.  Keep all follow-up visits as told by your health care provider. This is important. Contact a health care provider if:  You have a fever.  You have redness, swelling, or pain at the puncture site that lasts longer than a few days.  You have fluid, blood, or pus coming from your puncture site.  Your puncture site feels warm to the touch. Get help right away if:  You have severe bleeding from the puncture site. Summary  After the procedure, it is common to have soreness, bruising, or mild pain at the puncture  site. This should go away in a few days.  Check your puncture site every day for signs of infection, such as redness, swelling, or pain.  Get help right away if you have severe bleeding from your puncture site. This information is not intended to replace advice given to you by your health care provider. Make sure you discuss any questions you have with your health care provider. Document Revised: 07/13/2017 Document Reviewed: 05/14/2017 Elsevier Patient Education  2020 [employer]  

## 2020-02-25 NOTE — Procedures (Signed)
Interventional Radiology Procedure Note  Procedure: [place] CORE BX LEFT PAROTID MASS    Complications: None  Estimated Blood Loss:  MIN  Findings: 18 G CORES X 5    M. Daryll Brod, MD

## 2020-03-01 ENCOUNTER — Other Ambulatory Visit: Payer: Self-pay | Admitting: Anatomic Pathology & Clinical Pathology

## 2020-03-02 ENCOUNTER — Other Ambulatory Visit: Payer: Self-pay | Admitting: Unknown Physician Specialty

## 2020-03-02 DIAGNOSIS — R221 Localized swelling, mass and lump, neck: Secondary | ICD-10-CM

## 2020-03-02 LAB — SURGICAL PATHOLOGY

## 2020-03-04 ENCOUNTER — Other Ambulatory Visit: Payer: Managed Care, Other (non HMO)

## 2020-03-04 ENCOUNTER — Other Ambulatory Visit: Payer: Self-pay

## 2020-03-04 ENCOUNTER — Encounter: Payer: Self-pay | Admitting: Oncology

## 2020-03-04 ENCOUNTER — Ambulatory Visit
Admission: RE | Admit: 2020-03-04 | Discharge: 2020-03-04 | Disposition: A | Discharge status: Home / Self Care | Payer: Managed Care, Other (non HMO) | Source: Ambulatory Visit | Attending: Unknown Physician Specialty | Admitting: Unknown Physician Specialty

## 2020-03-04 DIAGNOSIS — I2510 Atherosclerotic heart disease of native coronary artery without angina pectoris: Secondary | ICD-10-CM | POA: Insufficient documentation

## 2020-03-04 DIAGNOSIS — I728 Aneurysm of other specified arteries: Secondary | ICD-10-CM | POA: Insufficient documentation

## 2020-03-04 DIAGNOSIS — R221 Localized swelling, mass and lump, neck: Secondary | ICD-10-CM

## 2020-03-04 DIAGNOSIS — Z9884 Bariatric surgery status: Secondary | ICD-10-CM | POA: Insufficient documentation

## 2020-03-04 DIAGNOSIS — I700 Atherosclerosis of aorta: Secondary | ICD-10-CM | POA: Insufficient documentation

## 2020-03-04 LAB — GLUCOSE, CAPILLARY: Glucose-Capillary: 83 mg/dL (ref 70–99)

## 2020-03-04 MED ORDER — FLUDEOXYGLUCOSE F - 18 (FDG) INJECTION
13.3000 | Freq: Once | INTRAVENOUS | Status: AC | PRN
  Administered 2020-03-04: 13.3 via INTRAVENOUS

## 2020-03-05 ENCOUNTER — Inpatient Hospital Stay: Payer: Managed Care, Other (non HMO)

## 2020-03-05 ENCOUNTER — Inpatient Hospital Stay: Payer: Managed Care, Other (non HMO) | Attending: Oncology | Admitting: Oncology

## 2020-03-05 VITALS — BP 136/83 | HR 65 | Temp 97.0°F | Resp 16 | Wt 256.9 lb

## 2020-03-05 DIAGNOSIS — Z85820 Personal history of malignant melanoma of skin: Secondary | ICD-10-CM

## 2020-03-05 DIAGNOSIS — Z808 Family history of malignant neoplasm of other organs or systems: Secondary | ICD-10-CM | POA: Insufficient documentation

## 2020-03-05 DIAGNOSIS — C07 Malignant neoplasm of parotid gland: Secondary | ICD-10-CM | POA: Insufficient documentation

## 2020-03-05 DIAGNOSIS — R221 Localized swelling, mass and lump, neck: Secondary | ICD-10-CM

## 2020-03-05 NOTE — Progress Notes (Signed)
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  INTERVAL HISTORY: Patient is a 64 year old male who noticed mass in his left neck. Subsequent CT scan, biopsy, and PET scan revealed the above-stated malignancy. He currently feels well and is asymptomatic. He has no neurologic complaints. He denies any recent fevers or illnesses. He has a good appetite and denies weight loss. He denies any dysphagia or neck pain. He has no chest pain, shortness of breath, cough, or hemoptysis. He denies any nausea, vomiting, constipation, or diarrhea. He has no urinary complaints. Patient feels at his baseline and offers no further specific complaints today.  REVIEW OF SYSTEMS:   Review of Systems  Constitutional: Negative.  Negative for fever, malaise/fatigue and weight loss.  Respiratory: Negative.  Negative for cough and shortness of breath.   Cardiovascular: Negative.  Negative for chest pain and leg swelling.  Gastrointestinal: Negative.  Negative for abdominal pain.  Genitourinary: Negative.  Negative for dysuria.  Musculoskeletal: Negative.  Negative for back pain.  Skin: Negative.  Negative for rash.  Neurological: Negative.  Negative for dizziness, focal weakness, weakness and headaches.  Psychiatric/Behavioral: Negative.  The patient is not nervous/anxious.     As per HPI. Otherwise, a complete review of systems is negative.  PAST MEDICAL HISTORY: Past Medical History:  Diagnosis Date  . Arthritis    "right index" (03/28/2016)  . B12 deficiency   . Basal cell carcinoma of right side of nose   . Basal cell carcinoma of skin of nose    top of bridge  . Chronic back pain    "lower back and cervical spine" (03/28/2016)  . Depression    "stress related"  (03/18/2016)  . GERD (gastroesophageal reflux disease)   . History of kidney stones   . Iron deficiency anemia   . Normocytic anemia   . OSA (obstructive sleep apnea)    no cpap after weight loss (03/28/2016)  . Seasonal allergies   . Ventral hernia     PAST SURGICAL HISTORY: Past Surgical History:  Procedure Laterality Date  . AIR/FLUID EXCHANGE Left 03/28/2016   Procedure: AIR/FLUID EXCHANGE;  Surgeon: Hayden Pedro, MD;  Location: [facility];  Service: Ophthalmology;  Laterality: Left;  . APPENDECTOMY  2001  . BASAL CELL CARCINOMA EXCISION     "bridge of nose and right side of nose at crease"  . CATARACT EXTRACTION W/ INTRAOCULAR LENS  IMPLANT, BILATERAL Bilateral 2013-2015   left-right  . CHOLECYSTECTOMY OPEN  2001  . EXTRACORPOREAL SHOCK WAVE LITHOTRIPSY  "4-5 times"  . EYE SURGERY    . PARS PLANA VITRECTOMY Left 03/29/2016   WITH 25G REMOVAL/SUTURE INTRAOCULAR LENS   . PARS PLANA VITRECTOMY Left 03/28/2016   Procedure: PARS PLANA VITRECTOMY WITH 25G REMOVAL/SUTURE INTRAOCULAR LENS;  Surgeon: Hayden Pedro, MD;  Location: [facility];  Service: Ophthalmology;  Laterality: Left;  . ROUX-EN-Y GASTRIC BYPASS  2001  . SEPTOPLASTY  1986  . TONSILLECTOMY AND ADENOIDECTOMY  1965    FAMILY HISTORY: Family History  Problem Relation Age of Onset  . Diabetes Other   . Heart disease Other   . Anemia Other   . Brain cancer Other     ADVANCED DIRECTIVES (Y/N):  N  HEALTH MAINTENANCE: Social History   Tobacco Use  . Smoking status:  Never Smoker  . Smokeless tobacco: Never Used  Vaping Use  . Vaping Use: Never used  Substance Use Topics  . Alcohol use: Yes    Comment: 03/28/2016 "might have a drink a couple times/month"  . Drug use: No     Colonoscopy:  PAP:  Bone density:  Lipid panel:  Allergies  Allergen Reactions  . Penicillins Hives    Current Outpatient Medications  Medication Sig Dispense Refill  . Ascorbic Acid (VITAMIN C) 1000 MG tablet Take 1,000 mg by  mouth daily.    . brimonidine (ALPHAGAN) 0.15 % ophthalmic solution Place 1 drop into the left eye 2 (two) times daily.    . Cholecalciferol (VITAMIN D) 50 MCG (2000 UT) tablet Take 2,000 Units by mouth daily.    . Cyanocobalamin (B-12) 5000 MCG CAPS Take 1 capsule by mouth daily.    . cyclobenzaprine (FLEXERIL) 10 MG tablet   3  . Diclofenac-miSOPROStol 50-0.2 MG TBEC Take 1 tablet by mouth 2 (two) times daily as needed.    . dicyclomine (BENTYL) 20 MG tablet Take 20 mg by mouth every 6 (six) hours.    Marland Kitchen etodolac (LODINE) 400 MG tablet Take 400 mg by mouth 2 (two) times daily.    . famotidine (PEPCID) 40 MG tablet Take 40 mg by mouth daily.     . ferrous sulfate 325 (65 FE) MG EC tablet Take 325 mg by mouth daily.     . Multiple Vitamin (MULTI-VITAMINS) TABS Take 1 tablet by mouth daily.     Marland Kitchen venlafaxine XR (EFFEXOR-XR) 37.5 MG 24 hr capsule Take 37.5 mg by mouth daily.     No current facility-administered medications for this visit.    OBJECTIVE: Vitals:   03/05/20 1104  BP: 136/83  Pulse: 65  Resp: 16  Temp: (!) 97 F (36.1 C)  SpO2: 99%     Body mass index is 32.11 kg/m.    ECOG FS:0 - Asymptomatic  General: Well-developed, well-nourished, no acute distress. Eyes: Pink conjunctiva, anicteric sclera. HEENT: Normocephalic, moist mucous membranes. No palpable lymphadenopathy. Lungs: No audible wheezing or coughing. Heart: Regular rate and rhythm. Abdomen: Soft, nontender, no obvious distention. Musculoskeletal: No edema, cyanosis, or clubbing. Neuro: Alert, answering all questions appropriately. Cranial nerves grossly intact. Skin: No rashes or petechiae noted. Psych: Normal affect. Lymphatics: No cervical, calvicular, axillary or inguinal LAD.   LAB RESULTS:  Lab Results  Component Value Date   NA 140 07/26/2015   K 3.9 07/26/2015   CL 109 07/26/2015   CO2 23 07/26/2015   GLUCOSE 90 07/26/2015   BUN 18 07/26/2015   CREATININE 1.00 01/30/2020   CALCIUM 8.8 (L)  07/26/2015   PROT 6.4 03/29/2018   ALBUMIN 4.4 03/29/2018   AST 26 03/29/2018   ALT 25 03/29/2018   ALKPHOS 50 03/29/2018   BILITOT 0.5 03/29/2018   GFRNONAA >60 07/26/2015   GFRAA >60 07/26/2015    Lab Results  Component Value Date   WBC 6.5 03/28/2016   HGB 13.9 03/28/2016   HCT 41.0 03/28/2016   MCV 95.3 03/28/2016   PLT 184 03/28/2016     STUDIES: NM PET Image Initial (PI) Skull Base To Thigh  Result Date: 03/04/2020 CLINICAL DATA:  Initial treatment strategy for neck mass biopsied found to have squamous cell carcinoma in the LEFT parotid gland. EXAM: NUCLEAR MEDICINE PET SKULL BASE TO THIGH TECHNIQUE: 13.3 mCi F-18 FDG was injected intravenously. Full-ring PET imaging was performed from the skull base to thigh after the  radiotracer. CT data was obtained and used for attenuation correction and anatomic localization. Fasting blood glucose: 83 mg/dl COMPARISON:  CT neck of 01/30/2020 FINDINGS: Mediastinal blood pool activity: SUV max 2.75 Liver activity: SUV max NA NECK: LEFT parotid mass that measures 1.9 by 1.8 cm (image 67 of series 3) (SUVmax = 14.4) No additional areas of focal uptake within the neck. Incidental CT findings: none CHEST: No hypermetabolic mediastinal or hilar nodes. No suspicious pulmonary nodules on the CT scan. Incidental CT findings: Calcified coronary artery disease. Calcification of LEFT and RIGHT coronary circulation. No pericardial effusion. Heart size is normal. Aortic caliber is normal. Central pulmonary vasculature with normal caliber. Limited assessment of cardiovascular structures given lack of intravenous contrast. Elevated LEFT hemidiaphragm. Minimal LEFT lower lobe atelectasis. No effusion. No chest wall mass. ABDOMEN/PELVIS: No abnormal hypermetabolic activity within the liver, pancreas, adrenal glands, or spleen. No hypermetabolic lymph nodes in the abdomen or pelvis. Incidental CT findings: Post cholecystectomy. Liver with normal contour. Spleen with  normal size and contour. Pancreas, adrenal glands and kidneys without acute process. Post gastric bypass procedure. No acute gastrointestinal process. Calcified atheromatous plaque of the abdominal aorta. No abdominal aortic aneurysm. 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 Moderate fat containing RIGHT inguinal hernia. Prostatomegaly. SKELETON: No focal hypermetabolic activity to suggest skeletal metastasis. Incidental CT findings: Spinal degenerative changes. No acute or destructive bone finding. IMPRESSION: 1. Hypermetabolic LEFT parotid mass corresponding to abnormality seen on previous CT without additional areas of uptake in the neck, chest, abdomen or pelvis to indicate additional sites of involvement. 2. Incidental findings of atherosclerosis, coronary artery disease and postoperative changes of gastric bypass procedure. 3. Slightly enlarged small splenic artery aneurysm with peripheral calcification, attention on follow-up. Aortic Atherosclerosis (ICD10-I70.0). Electronically Signed   By: Zetta Bills M.D.   On: 03/04/2020 10:16   [place] CORE BIOPSY (SALIVARY GLAND/PAROTID GLAND)  Result Date: 02/25/2020 INDICATION: Left parotid mass, previous biopsy nondiagnostic EXAM: ULTRASOUND 18 GAUGE CORE BIOPSY LEFT PAROTID MASS Date:  02/25/2020 02/25/2020 10:56 am Radiologist:  M. Daryll Brod, MD Guidance:  ULTRASOUND FLUOROSCOPY TIME:  None. MEDICATIONS: 1% lidocaine local ANESTHESIA/SEDATION: Moderate Sedation Time: None. CONTRAST:  None. COMPLICATIONS: None immediate. PROCEDURE: Informed consent was obtained from the patient following explanation of the procedure, risks, benefits and alternatives. The patient understands, agrees and consents for the procedure. All questions were addressed. A time out was performed. Maximal barrier sterile technique utilized including caps, mask, sterile gowns, sterile gloves,  large sterile drape, hand hygiene, and ChloraPrep. Previous imaging reviewed. Preliminary ultrasound performed. The left parotid lesion was localized and marked. Lesion measures roughly 2.3 cm. Under sterile conditions and local anesthesia, an 18 gauge core biopsy was advanced to the lesion. 5 18 gauge core biopsies obtained. Samples were intact and non fragmented. These were placed in formalin. Images obtained for documentation. Postprocedure imaging demonstrates no hemorrhage or hematoma. Patient tolerated biopsy well. IMPRESSION: Successful ultrasound 18 gauge core biopsy of the left parotid mass. Electronically Signed   By: Jerilynn Mages.  Shick M.D.   On: 02/25/2020 11:08   [place] CORE BIOPSY (SALIVARY GLAND/PAROTID GLAND)  Result Date: 02/10/2020 INDICATION: 64 year old male with left parotid mass. EXAM: Ultrasound-guided fine-needle aspiration and core biopsy of left parotid gland MEDICATIONS: None. ANESTHESIA/SEDATION: The procedure was performed under local anesthetic only. COMPLICATIONS: None immediate. PROCEDURE: Informed written consent was obtained from the patient after a thorough discussion of the procedural risks, benefits  and alternatives. All questions were addressed. Maximal Sterile Barrier Technique was utilized including caps, mask, sterile gowns, sterile gloves, sterile drape, hand hygiene and skin antiseptic. A timeout was performed prior to the initiation of the procedure. The left neck was prepped and draped in standard fashion. Preprocedure ultrasound demonstrated a hypoechoic mass in the left parotid gland measuring 2.5 x 1.8 x 1.5 cm. The procedure was planned. 1% lidocaine local anesthetic was administered subdermally as well as along the planned needle entry point to the level of the parotid gland. A total of 3 25 gauge fine needle aspirations were performed. Samples were handed directly to the sided technology tech, and it was deemed to there was not adequate sample for diagnosis. Therefore, a  total of 3, 18 gauge core needle biopsies were performed under ultrasound guidance. Samples are placed in formalin and sent to pathology. Postprocedure imaging demonstrated no evidence of hematoma around the left parotid gland. The patient tolerated the procedure well without immediate complication IMPRESSION: Technically successful core needle biopsy of left parotid gland mass. Fine-needle aspiration was performed, however there was little to no yield. Ruthann Cancer, MD [facility] Radiology Electronically Signed   By: Ruthann Cancer MD   On: 02/10/2020 11:32   [place] FNA SALIVARY GLAND/PAROTID GLAND  Result Date: 02/10/2020 INDICATION: 64 year old male with left parotid mass. EXAM: Ultrasound-guided fine-needle aspiration and core biopsy of left parotid gland MEDICATIONS: None. ANESTHESIA/SEDATION: The procedure was performed under local anesthetic only. COMPLICATIONS: None immediate. PROCEDURE: Informed written consent was obtained from the patient after a thorough discussion of the procedural risks, benefits and alternatives. All questions were addressed. Maximal Sterile Barrier Technique was utilized including caps, mask, sterile gowns, sterile gloves, sterile drape, hand hygiene and skin antiseptic. A timeout was performed prior to the initiation of the procedure. The left neck was prepped and draped in standard fashion. Preprocedure ultrasound demonstrated a hypoechoic mass in the left parotid gland measuring 2.5 x 1.8 x 1.5 cm. The procedure was planned. 1% lidocaine local anesthetic was administered subdermally as well as along the planned needle entry point to the level of the parotid gland. A total of 3 25 gauge fine needle aspirations were performed. Samples were handed directly to the sided technology tech, and it was deemed to there was not adequate sample for diagnosis. Therefore, a total of 3, 18 gauge core needle biopsies were performed under ultrasound  guidance. Samples are placed in formalin and sent to pathology. Postprocedure imaging demonstrated no evidence of hematoma around the left parotid gland. The patient tolerated the procedure well without immediate complication IMPRESSION: Technically successful core needle biopsy of left parotid gland mass. Fine-needle aspiration was performed, however there was little to no yield. 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  PLAN:    1. 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 PET scan results from March 04, 2020 reviewed independently and reported as above with no hypermetabolic activity outside the parotid gland. Given the location of the facial nerve, would like to avoid surgery if possible. Given the stage of disease, patient may benefit from XRT alone, but will further discuss with radiation oncology to see if weekly low-dose cisplatin would be beneficial. Follow-up will be based on whether chemotherapy is necessary.  I spent a total of 60 minutes reviewing chart data, face-to-face evaluation with the patient, counseling and coordination of care as detailed above.  Patient expressed understanding and was in agreement with this plan. He also understands that He can call clinic at any time with any questions, concerns, or complaints.   Cancer Staging Squamous cell carcinoma of parotid [facility]) Staging form: Major Salivary Glands, AJCC 8th Edition - Clinical stage from 03/05/2020: Stage II (cT2, cN0, cM0) - Signed by Lloyd Huger, MD on 03/05/2020   Lloyd Huger, MD   03/05/2020 12:44 PM

## 2020-03-05 NOTE — Progress Notes (Signed)
Tumor Board Documentation  TERRICK ALLRED was presented by Dr Tami Ribas at our Tumor Board on 03/04/2020, which included representatives from medical oncology, radiation oncology, surgical oncology, internal medicine, navigation, pathology, radiology, surgical, pharmacy, genetics, palliative care, research, pulmonology.  Tuan currently presents as a new patient, for [place], for new positive pathology with history of the following treatments: surgical intervention(s).  Additionally, we reviewed previous medical and familial history, history of present illness, and recent lab results along with all available histopathologic and imaging studies. The tumor board considered available treatment options and made the following recommendations:   Refer to Medical Omcology, Radiation Therapy plus or minus CHemotherapy vs Surgery  The following procedures/referrals were also placed: No orders of the defined types were placed in this encounter.   Clinical Trial Status: not discussed   Staging used: To be determined  AJCC Staging:       Group: Squamous Cell Carcinoma of Parotid  National site-specific guidelines   were discussed with respect to the case.  Tumor board is a meeting of clinicians from various specialty areas who evaluate and discuss patients for whom a multidisciplinary approach is being considered. Final determinations in the plan of care are those of the provider(s). The responsibility for follow up of recommendations given during tumor board is that of the provider.   Today's extended care, comprehensive team conference, [place] was not present for the discussion and was not examined.   Multidisciplinary Tumor Board is a multidisciplinary case peer review process.  Decisions discussed in the Multidisciplinary Tumor Board reflect the opinions of the specialists present at the conference without having examined the patient.  Ultimately, treatment and diagnostic decisions rest with the  primary provider(s) and the patient.

## 2020-03-08 DIAGNOSIS — I700 Atherosclerosis of aorta: Secondary | ICD-10-CM | POA: Insufficient documentation

## 2020-03-09 ENCOUNTER — Inpatient Hospital Stay: Admission: RE | Admit: 2020-03-09 | Payer: Managed Care, Other (non HMO) | Source: Ambulatory Visit

## 2020-03-09 ENCOUNTER — Other Ambulatory Visit: Payer: Self-pay

## 2020-03-09 ENCOUNTER — Encounter: Payer: Self-pay | Admitting: Radiation Oncology

## 2020-03-09 ENCOUNTER — Ambulatory Visit
Admission: RE | Admit: 2020-03-09 | Discharge: 2020-03-09 | Disposition: A | Discharge status: Home / Self Care | Payer: Managed Care, Other (non HMO) | Source: Ambulatory Visit | Attending: Radiation Oncology | Admitting: Radiation Oncology

## 2020-03-09 VITALS — BP 125/90 | HR 60 | Temp 97.3°F | Wt 254.0 lb

## 2020-03-09 DIAGNOSIS — F329 Major depressive disorder, single episode, unspecified: Secondary | ICD-10-CM | POA: Diagnosis not present

## 2020-03-09 DIAGNOSIS — M129 Arthropathy, unspecified: Secondary | ICD-10-CM | POA: Diagnosis not present

## 2020-03-09 DIAGNOSIS — G4730 Sleep apnea, unspecified: Secondary | ICD-10-CM | POA: Diagnosis not present

## 2020-03-09 DIAGNOSIS — C07 Malignant neoplasm of parotid gland: Secondary | ICD-10-CM | POA: Diagnosis present

## 2020-03-09 DIAGNOSIS — D509 Iron deficiency anemia, unspecified: Secondary | ICD-10-CM | POA: Diagnosis not present

## 2020-03-09 DIAGNOSIS — M549 Dorsalgia, unspecified: Secondary | ICD-10-CM | POA: Diagnosis not present

## 2020-03-09 DIAGNOSIS — K219 Gastro-esophageal reflux disease without esophagitis: Secondary | ICD-10-CM | POA: Insufficient documentation

## 2020-03-09 DIAGNOSIS — E538 Deficiency of other specified B group vitamins: Secondary | ICD-10-CM | POA: Diagnosis not present

## 2020-03-09 DIAGNOSIS — Z85828 Personal history of other malignant neoplasm of skin: Secondary | ICD-10-CM | POA: Diagnosis not present

## 2020-03-09 DIAGNOSIS — G8929 Other chronic pain: Secondary | ICD-10-CM | POA: Diagnosis not present

## 2020-03-09 DIAGNOSIS — Z79899 Other long term (current) drug therapy: Secondary | ICD-10-CM | POA: Diagnosis not present

## 2020-03-09 NOTE — Consult Note (Signed)
NEW PATIENT EVALUATION  Name: [name]  MRN: [id number]  Date:   03/09/2020     DOB: 09-30-1955   This 64 y.o. male patient presents to the clinic for initial evaluation of squamous cell carcinoma the left parotid.  REFERRING PHYSICIAN: Rusty Aus, MD  CHIEF COMPLAINT:  Chief Complaint  Patient presents with  . Consult    DIAGNOSIS: The encounter diagnosis was Squamous cell carcinoma of parotid ([place]).   PREVIOUS INVESTIGATIONS:  CT scans and PET CT scans reviewed Pathology report reviewed Clinical notes reviewed  HPI: Patient is a 64 year old male who presented with a slow-growing mass in his left parotid bed.  He had no head and neck pain or dysphagia.  CT scan head and neck region showed a palpable abnormality corresponding to a heterogeneous soft tissue nodule in left parotid gland measuring 2.8 cm most compatible with a primary salivary gland neoplasm.  No lymphadenopathy was noted.  PET CT scan showed this area to be hypermetabolic with no other sites in the head and neck region cervical lymph nodes chest abdomen or pelvis.  He underwent core biopsy which was positive for invasive noncharacterizing squamous cell carcinoma.  He has been seen by medical oncology.  He is seen today for radiation oncology opinion again he is asymptomatic.  PLANNED TREATMENT REGIMEN: Concurrent chemoradiation  PAST MEDICAL HISTORY:  has a past medical history of Arthritis, B12 deficiency, Basal cell carcinoma of right side of nose, Basal cell carcinoma of skin of nose, Chronic back pain, Depression, GERD (gastroesophageal reflux disease), History of kidney stones, Iron deficiency anemia, Normocytic anemia, OSA (obstructive sleep apnea), Seasonal allergies, and Ventral hernia.    PAST SURGICAL HISTORY:  Past Surgical History:  Procedure Laterality Date  . AIR/FLUID EXCHANGE Left 03/28/2016   Procedure: AIR/FLUID EXCHANGE;  Surgeon: Hayden Pedro, MD;  Location: [place];  Service:  Ophthalmology;  Laterality: Left;  . APPENDECTOMY  2001  . BASAL CELL CARCINOMA EXCISION     "bridge of nose and right side of nose at crease"  . CATARACT EXTRACTION W/ INTRAOCULAR LENS  IMPLANT, BILATERAL Bilateral 2013-2015   left-right  . CHOLECYSTECTOMY OPEN  2001  . EXTRACORPOREAL SHOCK WAVE LITHOTRIPSY  "4-5 times"  . EYE SURGERY    . PARS PLANA VITRECTOMY Left 03/29/2016   WITH 25G REMOVAL/SUTURE INTRAOCULAR LENS   . PARS PLANA VITRECTOMY Left 03/28/2016   Procedure: PARS PLANA VITRECTOMY WITH 25G REMOVAL/SUTURE INTRAOCULAR LENS;  Surgeon: Hayden Pedro, MD;  Location: [place];  Service: Ophthalmology;  Laterality: Left;  . ROUX-EN-Y GASTRIC BYPASS  2001  . SEPTOPLASTY  1986  . TONSILLECTOMY AND ADENOIDECTOMY  1965    FAMILY HISTORY: family history includes Anemia in an other family member; Brain cancer in an other family member; Diabetes in an other family member; Heart disease in an other family member.  SOCIAL HISTORY:  reports that he has never smoked. He has never used smokeless tobacco. He reports current alcohol use. He reports that he does not use drugs.  ALLERGIES: Penicillins  MEDICATIONS:  Current Outpatient Medications  Medication Sig Dispense Refill  . Ascorbic Acid (VITAMIN C) 1000 MG tablet Take 1,000 mg by mouth daily.    . brimonidine (ALPHAGAN) 0.15 % ophthalmic solution Place 1 drop into the left eye 2 (two) times daily.    . Cholecalciferol (VITAMIN D) 50 MCG (2000 UT) tablet Take 2,000 Units by mouth daily.    . Cyanocobalamin (B-12) 5000 MCG CAPS Take 1 capsule by mouth  daily.    . cyclobenzaprine (FLEXERIL) 10 MG tablet   3  . Diclofenac-miSOPROStol 50-0.2 MG TBEC Take 1 tablet by mouth 2 (two) times daily as needed.    . dicyclomine (BENTYL) 20 MG tablet Take 20 mg by mouth every 6 (six) hours.    . famotidine (PEPCID) 40 MG tablet Take 40 mg by mouth daily.     . ferrous sulfate 325 (65 FE) MG EC tablet Take 325 mg by mouth daily.     . Multiple  Vitamin (MULTI-VITAMINS) TABS Take 1 tablet by mouth daily.     . rosuvastatin (CRESTOR) 5 MG tablet Take 5 mg by mouth daily.    Marland Kitchen venlafaxine XR (EFFEXOR-XR) 37.5 MG 24 hr capsule Take 37.5 mg by mouth daily.    Marland Kitchen etodolac (LODINE) 400 MG tablet Take 400 mg by mouth 2 (two) times daily.     No current facility-administered medications for this encounter.    ECOG PERFORMANCE STATUS:  0 - Asymptomatic  REVIEW OF SYSTEMS: Patient denies any weight loss, fatigue, weakness, fever, chills or night sweats. Patient denies any loss of vision, blurred vision. Patient denies any ringing  of the ears or hearing loss. No irregular heartbeat. Patient denies heart murmur or history of fainting. Patient denies any chest pain or pain radiating to her upper extremities. Patient denies any shortness of breath, difficulty breathing at night, cough or hemoptysis. Patient denies any swelling in the lower legs. Patient denies any nausea vomiting, vomiting of blood, or coffee ground material in the vomitus. Patient denies any stomach pain. Patient states has had normal bowel movements no significant constipation or diarrhea. Patient denies any dysuria, hematuria or significant nocturia. Patient denies any problems walking, swelling in the joints or loss of balance. Patient denies any skin changes, loss of hair or loss of weight. Patient denies any excessive worrying or anxiety or significant depression. Patient denies any problems with insomnia. Patient denies excessive thirst, polyuria, polydipsia. Patient denies any swollen glands, patient denies easy bruising or easy bleeding. Patient denies any recent infections, allergies or URI. Patient "s visual fields have not changed significantly in recent time.   PHYSICAL EXAM: BP 125/90   Pulse 60   Temp (!) 97.3 F (36.3 C) (Tympanic)   Wt 254 lb (115.2 kg)   BMI 31.75 kg/m  Approximately 2 cm firm fixed mass in the left parotid bed.  No other evidence of lymphadenopathy  in the head and neck region or supraclavicular fossa is identified.  Well-developed well-nourished patient in NAD. HEENT reveals PERLA, EOMI, discs not visualized.  Oral cavity is clear. No oral mucosal lesions are identified. Neck is clear without evidence of cervical or supraclavicular adenopathy. Lungs are clear to A&P. Cardiac examination is essentially unremarkable with regular rate and rhythm without murmur rub or thrill. Abdomen is benign with no organomegaly or masses noted. Motor sensory and DTR levels are equal and symmetric in the upper and lower extremities. Cranial nerves II through XII are grossly intact. Proprioception is intact. No peripheral adenopathy or edema is identified. No motor or sensory levels are noted. Crude visual fields are within normal range.  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  I would plan on delivering 7000 cGy to his  hypermetabolic left parotid nodule.  We treated his cervical lymph nodes on the left side to 5400 centigrade using IMRT dose painting technique.  Risks and benefits of treatment including loss of taste possible temporary xerostomia fatigue possible dysphagia skin reaction and alteration of blood counts all were discussed in detail with the patient and his wife.  They both seem to comprehend my treatment plan well.  I have personally set up and ordered CT simulation for early next week.  I also believe will be prudent to go ahead with concurrent weekly cisplatin chemotherapy under Dr. Gary Fleet direction.  Based on the large size of the mass and unusual presentation radiation sensitizing chemotherapy should be beneficial.  There will be extra effort by both professional staff as well as  technical staff to coordinate and manage concurrent chemoradiation and ensuing side effects during his treatments. Patient and wife both comprehend the treatment plan well.   I would like to take this opportunity to thank you for allowing me to participate in the care of your patient.Noreene Filbert, MD

## 2020-03-13 NOTE — Progress Notes (Signed)
[place]  Telephone:([phone]) [phone] Fax:([phone]) [phone]  ID: [name] OB: 1955-12-05  MR#: [id number]  VOH#:[id number]  Patient Care Team: Rusty Aus, MD as PCP - General (Internal Medicine) Lloyd Huger, MD as Consulting Physician (Oncology)  CHIEF COMPLAINT: Stage II squamous cell of the parotid.  INTERVAL HISTORY: Patient returns to clinic today for further evaluation and treatment planning. He continues to feel well and remains asymptomatic. He has no neurologic complaints. He denies any recent fevers or illnesses. He has a good appetite and denies weight loss. He denies any dysphagia or neck pain. He has no chest pain, shortness of breath, cough, or hemoptysis. He denies any nausea, vomiting, constipation, or diarrhea. He has no urinary complaints. Patient offers no specific complaints today.  REVIEW OF SYSTEMS:   Review of Systems  Constitutional: Negative.  Negative for fever, malaise/fatigue and weight loss.  Respiratory: Negative.  Negative for cough and shortness of breath.   Cardiovascular: Negative.  Negative for chest pain and leg swelling.  Gastrointestinal: Negative.  Negative for abdominal pain.  Genitourinary: Negative.  Negative for dysuria.  Musculoskeletal: Negative.  Negative for back pain.  Skin: Negative.  Negative for rash.  Neurological: Negative.  Negative for dizziness, focal weakness, weakness and headaches.  Psychiatric/Behavioral: Negative.  The patient is not nervous/anxious.     As per HPI. Otherwise, a complete review of systems is negative.  PAST MEDICAL HISTORY: Past Medical History:  Diagnosis Date  . Arthritis    "right index" (03/28/2016)  . B12 deficiency   . Basal cell carcinoma of right side of nose   . Basal cell carcinoma of skin of nose    top of bridge  . Chronic back pain    "lower back and cervical spine" (03/28/2016)  . Depression    "stress related" (03/18/2016)  . GERD (gastroesophageal  reflux disease)   . History of kidney stones   . Iron deficiency anemia   . Normocytic anemia   . OSA (obstructive sleep apnea)    no cpap after weight loss (03/28/2016)  . Seasonal allergies   . Ventral hernia     PAST SURGICAL HISTORY: Past Surgical History:  Procedure Laterality Date  . AIR/FLUID EXCHANGE Left 03/28/2016   Procedure: AIR/FLUID EXCHANGE;  Surgeon: Hayden Pedro, MD;  Location: [place];  Service: Ophthalmology;  Laterality: Left;  . APPENDECTOMY  2001  . BASAL CELL CARCINOMA EXCISION     "bridge of nose and right side of nose at crease"  . CATARACT EXTRACTION W/ INTRAOCULAR LENS  IMPLANT, BILATERAL Bilateral 2013-2015   left-right  . CHOLECYSTECTOMY OPEN  2001  . EXTRACORPOREAL SHOCK WAVE LITHOTRIPSY  "4-5 times"  . EYE SURGERY    . PARS PLANA VITRECTOMY Left 03/29/2016   WITH 25G REMOVAL/SUTURE INTRAOCULAR LENS   . PARS PLANA VITRECTOMY Left 03/28/2016   Procedure: PARS PLANA VITRECTOMY WITH 25G REMOVAL/SUTURE INTRAOCULAR LENS;  Surgeon: Hayden Pedro, MD;  Location: [place];  Service: Ophthalmology;  Laterality: Left;  . ROUX-EN-Y GASTRIC BYPASS  2001  . SEPTOPLASTY  1986  . TONSILLECTOMY AND ADENOIDECTOMY  1965    FAMILY HISTORY: Family History  Problem Relation Age of Onset  . Diabetes Other   . Heart disease Other   . Anemia Other   . Brain cancer Other     ADVANCED DIRECTIVES (Y/N):  N  HEALTH MAINTENANCE: Social History   Tobacco Use  . Smoking status: Never Smoker  . Smokeless tobacco: Never Used  Vaping Use  . Vaping Use: Never used  Substance Use Topics  . Alcohol use: Yes    Comment: 03/28/2016 "might have a drink a couple times/month"  . Drug use: No     Colonoscopy:  PAP:  Bone density:  Lipid panel:  Allergies  Allergen Reactions  . Penicillins Hives    Current Outpatient Medications  Medication Sig Dispense Refill  . Ascorbic Acid (VITAMIN C) 1000 MG tablet Take 1,000 mg by mouth daily.    . brimonidine (ALPHAGAN)  0.15 % ophthalmic solution Place 1 drop into the left eye 2 (two) times daily.    . Cholecalciferol (VITAMIN D) 50 MCG (2000 UT) tablet Take 2,000 Units by mouth daily.    . Cyanocobalamin (B-12) 5000 MCG CAPS Take 1 capsule by mouth daily.    . cyclobenzaprine (FLEXERIL) 10 MG tablet   3  . Diclofenac-miSOPROStol 50-0.2 MG TBEC Take 1 tablet by mouth 2 (two) times daily as needed.    . dicyclomine (BENTYL) 20 MG tablet Take 20 mg by mouth every 6 (six) hours.    . famotidine (PEPCID) 40 MG tablet Take 40 mg by mouth daily.     . ferrous sulfate 325 (65 FE) MG EC tablet Take 325 mg by mouth daily.     . Multiple Vitamin (MULTI-VITAMINS) TABS Take 1 tablet by mouth daily.     . rosuvastatin (CRESTOR) 5 MG tablet Take 5 mg by mouth daily.    . prochlorperazine (COMPAZINE) 10 MG tablet Take 1 tablet (10 mg total) by mouth every 6 (six) hours as needed (Nausea or vomiting). 60 tablet 2  . venlafaxine XR (EFFEXOR-XR) 37.5 MG 24 hr capsule Take 37.5 mg by mouth daily.     No current facility-administered medications for this visit.    OBJECTIVE: Vitals:   03/15/20 1406  BP: 139/86  Pulse: 63  Temp: (!) 96.8 F (36 C)     Body mass index is 31.62 kg/m.    ECOG FS:0 - Asymptomatic  General: Well-developed, well-nourished, no acute distress. Eyes: Pink conjunctiva, anicteric sclera. HEENT: Normocephalic, moist mucous membranes. No palpable lymphadenopathy. Lungs: No audible wheezing or coughing. Heart: Regular rate and rhythm. Abdomen: Soft, nontender, no obvious distention. Musculoskeletal: No edema, cyanosis, or clubbing. Neuro: Alert, answering all questions appropriately. Cranial nerves grossly intact. Skin: No rashes or petechiae noted. Psych: Normal affect.    LAB RESULTS:  Lab Results  Component Value Date   NA 140 07/26/2015   K 3.9 07/26/2015   CL 109 07/26/2015   CO2 23 07/26/2015   GLUCOSE 90 07/26/2015   BUN 18 07/26/2015   CREATININE 1.00 01/30/2020   CALCIUM 8.8  (L) 07/26/2015   PROT 6.4 03/29/2018   ALBUMIN 4.4 03/29/2018   AST 26 03/29/2018   ALT 25 03/29/2018   ALKPHOS 50 03/29/2018   BILITOT 0.5 03/29/2018   GFRNONAA >60 07/26/2015   GFRAA >60 07/26/2015    Lab Results  Component Value Date   WBC 6.5 03/28/2016   HGB 13.9 03/28/2016   HCT 41.0 03/28/2016   MCV 95.3 03/28/2016   PLT 184 03/28/2016     STUDIES: NM PET Image Initial (PI) Skull Base To Thigh  Result Date: 03/04/2020 CLINICAL DATA:  Initial treatment strategy for neck mass biopsied found to have squamous cell carcinoma in the LEFT parotid gland. EXAM: NUCLEAR MEDICINE PET SKULL BASE TO THIGH TECHNIQUE: 13.3 mCi F-18 FDG was injected intravenously. Full-ring PET imaging was performed from the skull base to thigh  after the radiotracer. CT data was obtained and used for attenuation correction and anatomic localization. Fasting blood glucose: 83 mg/dl COMPARISON:  CT neck of 01/30/2020 FINDINGS: Mediastinal blood pool activity: SUV max 2.75 Liver activity: SUV max NA NECK: LEFT parotid mass that measures 1.9 by 1.8 cm (image 67 of series 3) (SUVmax = 14.4) No additional areas of focal uptake within the neck. Incidental CT findings: none CHEST: No hypermetabolic mediastinal or hilar nodes. No suspicious pulmonary nodules on the CT scan. Incidental CT findings: Calcified coronary artery disease. Calcification of LEFT and RIGHT coronary circulation. No pericardial effusion. Heart size is normal. Aortic caliber is normal. Central pulmonary vasculature with normal caliber. Limited assessment of cardiovascular structures given lack of intravenous contrast. Elevated LEFT hemidiaphragm. Minimal LEFT lower lobe atelectasis. No effusion. No chest wall mass. ABDOMEN/PELVIS: No abnormal hypermetabolic activity within the liver, pancreas, adrenal glands, or spleen. No hypermetabolic lymph nodes in the abdomen or pelvis. Incidental CT findings: Post cholecystectomy. Liver with normal contour. Spleen  with normal size and contour. Pancreas, adrenal glands and kidneys without acute process. Post gastric bypass procedure. No acute gastrointestinal process. Calcified atheromatous plaque of the abdominal aorta. No abdominal aortic aneurysm. 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 Moderate fat containing RIGHT inguinal hernia. Prostatomegaly. SKELETON: No focal hypermetabolic activity to suggest skeletal metastasis. Incidental CT findings: Spinal degenerative changes. No acute or destructive bone finding. IMPRESSION: 1. Hypermetabolic LEFT parotid mass corresponding to abnormality seen on previous CT without additional areas of uptake in the neck, chest, abdomen or pelvis to indicate additional sites of involvement. 2. Incidental findings of atherosclerosis, coronary artery disease and postoperative changes of gastric bypass procedure. 3. Slightly enlarged small splenic artery aneurysm with peripheral calcification, attention on follow-up. Aortic Atherosclerosis (ICD10-I70.0). Electronically Signed   By: Zetta Bills M.D.   On: 03/04/2020 10:16   [place] CORE BIOPSY (SALIVARY GLAND/PAROTID GLAND)  Result Date: 02/25/2020 INDICATION: Left parotid mass, previous biopsy nondiagnostic EXAM: ULTRASOUND 18 GAUGE CORE BIOPSY LEFT PAROTID MASS Date:  02/25/2020 02/25/2020 10:56 am Radiologist:  M. Daryll Brod, MD Guidance:  ULTRASOUND FLUOROSCOPY TIME:  None. MEDICATIONS: 1% lidocaine local ANESTHESIA/SEDATION: Moderate Sedation Time: None. CONTRAST:  None. COMPLICATIONS: None immediate. PROCEDURE: Informed consent was obtained from the patient following explanation of the procedure, risks, benefits and alternatives. The patient understands, agrees and consents for the procedure. All questions were addressed. A time out was performed. Maximal barrier sterile technique utilized including caps, mask, sterile gowns, sterile  gloves, large sterile drape, hand hygiene, and ChloraPrep. Previous imaging reviewed. Preliminary ultrasound performed. The left parotid lesion was localized and marked. Lesion measures roughly 2.3 cm. Under sterile conditions and local anesthesia, an 18 gauge core biopsy was advanced to the lesion. 5 18 gauge core biopsies obtained. Samples were intact and non fragmented. These were placed in formalin. Images obtained for documentation. Postprocedure imaging demonstrates no hemorrhage or hematoma. Patient tolerated biopsy well. IMPRESSION: Successful ultrasound 18 gauge core biopsy of the left parotid mass. Electronically Signed   By: Jerilynn Mages.  Shick M.D.   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  PLAN:    1. 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 PET scan results from March 04, 2020 reviewed independently and reported as above with no hypermetabolic activity outside the parotid gland. Given the location of the  facial nerve, would like to avoid surgery if possible. Plan to give weekly cisplatin 40 mg per metered squared along with daily XRT for 5-8 cycles. Patient has declined port placement at this time. Return to clinic on 03/24/2020 to initiate XRT and cycle 1 of weekly cisplatin.   I spent a total of 30 minutes reviewing chart data, face-to-face evaluation with the patient, counseling and coordination of care as detailed above.   Patient expressed understanding and was in agreement with this plan. He also understands that He can call clinic at any time with any questions, concerns, or complaints.   Cancer Staging Squamous cell carcinoma of parotid [facility]) Staging form: Major Salivary Glands, AJCC 8th Edition - Clinical stage from 03/05/2020: Stage II (cT2, cN0, cM0) - Signed by Lloyd Huger, MD on 03/05/2020   Lloyd Huger, MD   03/16/2020 6:35 AM

## 2020-03-15 ENCOUNTER — Ambulatory Visit
Admission: RE | Admit: 2020-03-15 | Discharge: 2020-03-15 | Disposition: A | Discharge status: Home / Self Care | Payer: Managed Care, Other (non HMO) | Source: Ambulatory Visit | Attending: Radiation Oncology | Admitting: Radiation Oncology

## 2020-03-15 ENCOUNTER — Inpatient Hospital Stay (HOSPITAL_BASED_OUTPATIENT_CLINIC_OR_DEPARTMENT_OTHER): Payer: Managed Care, Other (non HMO) | Admitting: Oncology

## 2020-03-15 ENCOUNTER — Other Ambulatory Visit: Payer: Self-pay

## 2020-03-15 ENCOUNTER — Encounter: Payer: Self-pay | Admitting: Oncology

## 2020-03-15 VITALS — BP 139/86 | HR 63 | Temp 96.8°F | Wt 253.0 lb

## 2020-03-15 DIAGNOSIS — Z7189 Other specified counseling: Secondary | ICD-10-CM | POA: Diagnosis not present

## 2020-03-15 DIAGNOSIS — C07 Malignant neoplasm of parotid gland: Secondary | ICD-10-CM

## 2020-03-15 DIAGNOSIS — Z79899 Other long term (current) drug therapy: Secondary | ICD-10-CM | POA: Insufficient documentation

## 2020-03-15 DIAGNOSIS — Z510 Encounter for antineoplastic radiation therapy: Secondary | ICD-10-CM | POA: Insufficient documentation

## 2020-03-15 DIAGNOSIS — Z5111 Encounter for antineoplastic chemotherapy: Secondary | ICD-10-CM | POA: Insufficient documentation

## 2020-03-15 DIAGNOSIS — R110 Nausea: Secondary | ICD-10-CM | POA: Diagnosis not present

## 2020-03-15 DIAGNOSIS — Z808 Family history of malignant neoplasm of other organs or systems: Secondary | ICD-10-CM | POA: Insufficient documentation

## 2020-03-15 NOTE — Progress Notes (Signed)
Patient here today for follow up, treatment planning regarding head and neck cancer.

## 2020-03-15 NOTE — Patient Instructions (Signed)

## 2020-03-16 ENCOUNTER — Inpatient Hospital Stay: Payer: Managed Care, Other (non HMO)

## 2020-03-16 DIAGNOSIS — Z7189 Other specified counseling: Secondary | ICD-10-CM | POA: Insufficient documentation

## 2020-03-16 MED ORDER — PROCHLORPERAZINE MALEATE 10 MG PO TABS: 10.0000 mg | ORAL_TABLET | Freq: Four times a day (QID) | ORAL | 2 refills | Status: DC | PRN

## 2020-03-16 NOTE — Progress Notes (Signed)
START OFF PATHWAY REGIMEN - Head and Neck   [id number]:Cisplatin 40 mg/m2 IV D1 q7 Days + RT:   A cycle is every 7 days:     Cisplatin   **Always confirm dose/schedule in your pharmacy ordering system**  Patient Characteristics: Other Sites, Preoperative or Nonsurgical Candidate, Stage I - II Disease Classification: Other Sites AJCC 8 Stage Grouping: II Therapeutic Status: Preoperative or Nonsurgical Candidate Intent of Therapy: Curative Intent, Discussed with Patient

## 2020-03-16 NOTE — Progress Notes (Signed)
Patient on plan of care prior to pathways. 

## 2020-03-18 DIAGNOSIS — Z510 Encounter for antineoplastic radiation therapy: Secondary | ICD-10-CM | POA: Diagnosis not present

## 2020-03-19 ENCOUNTER — Other Ambulatory Visit: Payer: Self-pay | Admitting: *Deleted

## 2020-03-19 MED ORDER — ONDANSETRON HCL 8 MG PO TABS: 8.0000 mg | ORAL_TABLET | Freq: Three times a day (TID) | ORAL | 1 refills | Status: DC | PRN

## 2020-03-21 NOTE — Progress Notes (Signed)
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  INTERVAL HISTORY: Patient returns to clinic today for further evaluation and initiation of cycle 1 of weekly cisplatin.  He initiated XRT earlier today.  He continues to feel well and remains asymptomatic. He has no neurologic complaints. He denies any recent fevers or illnesses. He has a good appetite and denies weight loss. He denies any dysphagia or neck pain. He has no chest pain, shortness of breath, cough, or hemoptysis. He denies any nausea, vomiting, constipation, or diarrhea. He has no urinary complaints.  Patient feels at his baseline offers no specific complaints today.  REVIEW OF SYSTEMS:   Review of Systems  Constitutional: Negative.  Negative for fever, malaise/fatigue and weight loss.  Respiratory: Negative.  Negative for cough and shortness of breath.   Cardiovascular: Negative.  Negative for chest pain and leg swelling.  Gastrointestinal: Negative.  Negative for abdominal pain.  Genitourinary: Negative.  Negative for dysuria.  Musculoskeletal: Negative.  Negative for back pain.  Skin: Negative.  Negative for rash.  Neurological: Negative.  Negative for dizziness, focal weakness, weakness and headaches.  Psychiatric/Behavioral: Negative.  The patient is not nervous/anxious.     As per HPI. Otherwise, a complete review of systems is negative.  PAST MEDICAL HISTORY: Past Medical History:  Diagnosis Date  . Arthritis    "right index" (03/28/2016)  . B12 deficiency   . Basal cell carcinoma of right side of nose   . Basal cell carcinoma of skin of nose    top of bridge  . Chronic back pain    "lower back and cervical spine"  (03/28/2016)  . Depression    "stress related" (03/18/2016)  . GERD (gastroesophageal reflux disease)   . History of kidney stones   . Iron deficiency anemia   . Normocytic anemia   . OSA (obstructive sleep apnea)    no cpap after weight loss (03/28/2016)  . Seasonal allergies   . Ventral hernia     PAST SURGICAL HISTORY: Past Surgical History:  Procedure Laterality Date  . AIR/FLUID EXCHANGE Left 03/28/2016   Procedure: AIR/FLUID EXCHANGE;  Surgeon: Hayden Pedro, MD;  Location: [place];  Service: Ophthalmology;  Laterality: Left;  . APPENDECTOMY  2001  . BASAL CELL CARCINOMA EXCISION     "bridge of nose and right side of nose at crease"  . CATARACT EXTRACTION W/ INTRAOCULAR LENS  IMPLANT, BILATERAL Bilateral 2013-2015   left-right  . CHOLECYSTECTOMY OPEN  2001  . EXTRACORPOREAL SHOCK WAVE LITHOTRIPSY  "4-5 times"  . EYE SURGERY    . PARS PLANA VITRECTOMY Left 03/29/2016   WITH 25G REMOVAL/SUTURE INTRAOCULAR LENS   . PARS PLANA VITRECTOMY Left 03/28/2016   Procedure: PARS PLANA VITRECTOMY WITH 25G REMOVAL/SUTURE INTRAOCULAR LENS;  Surgeon: Hayden Pedro, MD;  Location: [facility];  Service: Ophthalmology;  Laterality: Left;  . ROUX-EN-Y GASTRIC BYPASS  2001  . SEPTOPLASTY  1986  . TONSILLECTOMY AND ADENOIDECTOMY  1965    FAMILY HISTORY: Family History  Problem Relation Age of Onset  . Diabetes Other   . Heart disease Other   . Anemia Other   . Brain cancer Other     ADVANCED DIRECTIVES (Y/N):  N  HEALTH MAINTENANCE: Social History  Tobacco Use  . Smoking status: Never Smoker  . Smokeless tobacco: Never Used  Vaping Use  . Vaping Use: Never used  Substance Use Topics  . Alcohol use: Yes    Comment: 03/28/2016 "might have a drink a couple times/month"  . Drug use: No     Colonoscopy:  PAP:  Bone density:  Lipid panel:  Allergies  Allergen Reactions  . Penicillins Hives    Current Outpatient Medications  Medication Sig Dispense Refill  . Ascorbic  Acid (VITAMIN C) 1000 MG tablet Take 1,000 mg by mouth daily.    . brimonidine (ALPHAGAN) 0.15 % ophthalmic solution Place 1 drop into the left eye 2 (two) times daily.    . Cholecalciferol (VITAMIN D) 50 MCG (2000 UT) tablet Take 2,000 Units by mouth daily.    . Cyanocobalamin (B-12) 5000 MCG CAPS Take 1 capsule by mouth daily.    . cyclobenzaprine (FLEXERIL) 10 MG tablet   3  . Diclofenac-miSOPROStol 50-0.2 MG TBEC Take 1 tablet by mouth 2 (two) times daily as needed.    . dicyclomine (BENTYL) 20 MG tablet Take 20 mg by mouth every 6 (six) hours.    . famotidine (PEPCID) 40 MG tablet Take 40 mg by mouth daily.     . ferrous sulfate 325 (65 FE) MG EC tablet Take 325 mg by mouth daily.     . Multiple Vitamin (MULTI-VITAMINS) TABS Take 1 tablet by mouth daily.     . ondansetron (ZOFRAN) 8 MG tablet Take 1 tablet (8 mg total) by mouth every 8 (eight) hours as needed for nausea or vomiting. 30 tablet 1  . prochlorperazine (COMPAZINE) 10 MG tablet Take 1 tablet (10 mg total) by mouth every 6 (six) hours as needed (Nausea or vomiting). 60 tablet 2  . rosuvastatin (CRESTOR) 5 MG tablet Take 5 mg by mouth daily.    Marland Kitchen venlafaxine XR (EFFEXOR-XR) 37.5 MG 24 hr capsule Take 37.5 mg by mouth daily.     No current facility-administered medications for this visit.   Facility-Administered Medications Ordered in Other Visits  Medication Dose Route Frequency Provider Last Rate Last Admin  . sodium chloride 0.9 % 1,000 mL with potassium chloride 20 mEq, magnesium sulfate 2 g infusion   Intravenous Once Lloyd Huger, MD        OBJECTIVE: Vitals:   03/24/20 0920  BP: 126/83  Pulse: 67  Resp: 17  Temp: (!) 96.7 F (35.9 C)  SpO2: 99%     Body mass index is 32.51 kg/m.    ECOG FS:0 - Asymptomatic  General: Well-developed, well-nourished, no acute distress. Eyes: Pink conjunctiva, anicteric sclera. HEENT: Normocephalic, moist mucous membranes.  Easily palpable left parotid nodule, no palpable  lymphadenopathy. Lungs: No audible wheezing or coughing. Heart: Regular rate and rhythm. Abdomen: Soft, nontender, no obvious distention. Musculoskeletal: No edema, cyanosis, or clubbing. Neuro: Alert, answering all questions appropriately. Cranial nerves grossly intact. Skin: No rashes or petechiae noted. Psych: Normal affect.  LAB RESULTS:  Lab Results  Component Value Date   NA 139 03/24/2020   K 4.2 03/24/2020   CL 106 03/24/2020   CO2 25 03/24/2020   GLUCOSE 80 03/24/2020   BUN 20 03/24/2020   CREATININE 1.08 03/24/2020   CALCIUM 8.9 03/24/2020   PROT 6.4 03/29/2018   ALBUMIN 4.4 03/29/2018   AST 26 03/29/2018   ALT 25 03/29/2018   ALKPHOS 50 03/29/2018   BILITOT 0.5 03/29/2018   GFRNONAA >60 03/24/2020   GFRAA >60 07/26/2015  Lab Results  Component Value Date   WBC 5.5 03/24/2020   NEUTROABS 3.3 03/24/2020   HGB 12.8 (L) 03/24/2020   HCT 36.8 (L) 03/24/2020   MCV 95.1 03/24/2020   PLT 186 03/24/2020     STUDIES: NM PET Image Initial (PI) Skull Base To Thigh  Result Date: 03/04/2020 CLINICAL DATA:  Initial treatment strategy for neck mass biopsied found to have squamous cell carcinoma in the LEFT parotid gland. EXAM: NUCLEAR MEDICINE PET SKULL BASE TO THIGH TECHNIQUE: 13.3 mCi F-18 FDG was injected intravenously. Full-ring PET imaging was performed from the skull base to thigh after the radiotracer. CT data was obtained and used for attenuation correction and anatomic localization. Fasting blood glucose: 83 mg/dl COMPARISON:  CT neck of 01/30/2020 FINDINGS: Mediastinal blood pool activity: SUV max 2.75 Liver activity: SUV max NA NECK: LEFT parotid mass that measures 1.9 by 1.8 cm (image 67 of series 3) (SUVmax = 14.4) No additional areas of focal uptake within the neck. Incidental CT findings: none CHEST: No hypermetabolic mediastinal or hilar nodes. No suspicious pulmonary nodules on the CT scan. Incidental CT findings: Calcified coronary artery disease.  Calcification of LEFT and RIGHT coronary circulation. No pericardial effusion. Heart size is normal. Aortic caliber is normal. Central pulmonary vasculature with normal caliber. Limited assessment of cardiovascular structures given lack of intravenous contrast. Elevated LEFT hemidiaphragm. Minimal LEFT lower lobe atelectasis. No effusion. No chest wall mass. ABDOMEN/PELVIS: No abnormal hypermetabolic activity within the liver, pancreas, adrenal glands, or spleen. No hypermetabolic lymph nodes in the abdomen or pelvis. Incidental CT findings: Post cholecystectomy. Liver with normal contour. Spleen with normal size and contour. Pancreas, adrenal glands and kidneys without acute process. Post gastric bypass procedure. No acute gastrointestinal process. Calcified atheromatous plaque of the abdominal aorta. No abdominal aortic aneurysm. 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 Moderate fat containing RIGHT inguinal hernia. Prostatomegaly. SKELETON: No focal hypermetabolic activity to suggest skeletal metastasis. Incidental CT findings: Spinal degenerative changes. No acute or destructive bone finding. IMPRESSION: 1. Hypermetabolic LEFT parotid mass corresponding to abnormality seen on previous CT without additional areas of uptake in the neck, chest, abdomen or pelvis to indicate additional sites of involvement. 2. Incidental findings of atherosclerosis, coronary artery disease and postoperative changes of gastric bypass procedure. 3. Slightly enlarged small splenic artery aneurysm with peripheral calcification, attention on follow-up. Aortic Atherosclerosis (ICD10-I70.0). Electronically Signed   By: Zetta Bills M.D.   On: 03/04/2020 10:16   [place] CORE BIOPSY (SALIVARY GLAND/PAROTID GLAND)  Result Date: 02/25/2020 INDICATION: Left parotid mass, previous biopsy nondiagnostic EXAM: ULTRASOUND 18 GAUGE CORE BIOPSY LEFT  PAROTID MASS Date:  02/25/2020 02/25/2020 10:56 am Radiologist:  M. Daryll Brod, MD Guidance:  ULTRASOUND FLUOROSCOPY TIME:  None. MEDICATIONS: 1% lidocaine local ANESTHESIA/SEDATION: Moderate Sedation Time: None. CONTRAST:  None. COMPLICATIONS: None immediate. PROCEDURE: Informed consent was obtained from the patient following explanation of the procedure, risks, benefits and alternatives. The patient understands, agrees and consents for the procedure. All questions were addressed. A time out was performed. Maximal barrier sterile technique utilized including caps, mask, sterile gowns, sterile gloves, large sterile drape, hand hygiene, and ChloraPrep. Previous imaging reviewed. Preliminary ultrasound performed. The left parotid lesion was localized and marked. Lesion measures roughly 2.3 cm. Under sterile conditions and local anesthesia, an 18 gauge core biopsy was advanced to the lesion. 5 18 gauge core biopsies obtained. Samples were intact and non fragmented. These were  placed in formalin. Images obtained for documentation. Postprocedure imaging demonstrates no hemorrhage or hematoma. Patient tolerated biopsy well. IMPRESSION: Successful ultrasound 18 gauge core biopsy of the left parotid mass. Electronically Signed   By: Jerilynn Mages.  Shick M.D.   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  PLAN:    1. 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 PET scan results from March 04, 2020 reviewed independently and reported as above with no hypermetabolic activity outside the parotid gland. Given the location of the facial nerve, would like to avoid surgery if possible. Plan to give weekly cisplatin 40 mg per metered squared along with daily XRT for 5-8 cycles. Patient has declined port placement at this time.  Patient initiated XRT today.  Proceed with cycle 1 of weekly cisplatin.  Return to clinic in 1 week for further  evaluation and consideration of cycle 2.  I spent a total of 30 minutes reviewing chart data, face-to-face evaluation with the patient, counseling and coordination of care as detailed above.    Patient expressed understanding and was in agreement with this plan. He also understands that He can call clinic at any time with any questions, concerns, or complaints.   Cancer Staging Squamous cell carcinoma of parotid [facility]) Staging form: Major Salivary Glands, AJCC 8th Edition - Clinical stage from 03/05/2020: Stage II (cT2, cN0, cM0) - Signed by Lloyd Huger, MD on 03/05/2020   Lloyd Huger, MD   03/24/2020 10:12 AM

## 2020-03-23 ENCOUNTER — Encounter: Payer: Self-pay | Admitting: Oncology

## 2020-03-23 ENCOUNTER — Ambulatory Visit: Admission: RE | Admit: 2020-03-23 | Payer: Managed Care, Other (non HMO) | Source: Ambulatory Visit

## 2020-03-24 ENCOUNTER — Ambulatory Visit
Admission: RE | Admit: 2020-03-24 | Discharge: 2020-03-24 | Disposition: A | Discharge status: Home / Self Care | Payer: Managed Care, Other (non HMO) | Source: Ambulatory Visit | Attending: Radiation Oncology | Admitting: Radiation Oncology

## 2020-03-24 ENCOUNTER — Inpatient Hospital Stay: Payer: Managed Care, Other (non HMO)

## 2020-03-24 ENCOUNTER — Inpatient Hospital Stay (HOSPITAL_BASED_OUTPATIENT_CLINIC_OR_DEPARTMENT_OTHER): Payer: Managed Care, Other (non HMO) | Admitting: Oncology

## 2020-03-24 VITALS — BP 115/80 | HR 61 | Temp 98.5°F | Resp 16

## 2020-03-24 VITALS — BP 126/83 | HR 67 | Temp 96.7°F | Resp 17 | Wt 260.1 lb

## 2020-03-24 DIAGNOSIS — C07 Malignant neoplasm of parotid gland: Secondary | ICD-10-CM | POA: Diagnosis not present

## 2020-03-24 DIAGNOSIS — Z510 Encounter for antineoplastic radiation therapy: Secondary | ICD-10-CM | POA: Diagnosis not present

## 2020-03-24 LAB — CBC WITH DIFFERENTIAL/PLATELET
Abs Immature Granulocytes: 0.01 10*3/uL (ref 0.00–0.07)
Basophils Absolute: 0.1 10*3/uL (ref 0.0–0.1)
Basophils Relative: 1 %
Eosinophils Absolute: 0.2 10*3/uL (ref 0.0–0.5)
Eosinophils Relative: 3 %
HCT: 36.8 % — ABNORMAL LOW (ref 39.0–52.0)
Hemoglobin: 12.8 g/dL — ABNORMAL LOW (ref 13.0–17.0)
Immature Granulocytes: 0 %
Lymphocytes Relative: 29 %
Lymphs Abs: 1.6 10*3/uL (ref 0.7–4.0)
MCH: 33.1 pg (ref 26.0–34.0)
MCHC: 34.8 g/dL (ref 30.0–36.0)
MCV: 95.1 fL (ref 80.0–100.0)
Monocytes Absolute: 0.4 10*3/uL (ref 0.1–1.0)
Monocytes Relative: 8 %
Neutro Abs: 3.3 10*3/uL (ref 1.7–7.7)
Neutrophils Relative %: 59 %
Platelets: 186 10*3/uL (ref 150–400)
RBC: 3.87 MIL/uL — ABNORMAL LOW (ref 4.22–5.81)
RDW: 12.9 % (ref 11.5–15.5)
WBC: 5.5 10*3/uL (ref 4.0–10.5)
nRBC: 0 % (ref 0.0–0.2)

## 2020-03-24 LAB — BASIC METABOLIC PANEL
Anion gap: 8 (ref 5–15)
BUN: 20 mg/dL (ref 8–23)
CO2: 25 mmol/L (ref 22–32)
Calcium: 8.9 mg/dL (ref 8.9–10.3)
Chloride: 106 mmol/L (ref 98–111)
Creatinine, Ser: 1.08 mg/dL (ref 0.61–1.24)
GFR, Estimated: >60 mL/min (ref >60)
Glucose, Bld: 80 mg/dL (ref 70–99)
Potassium: 4.2 mmol/L (ref 3.5–5.1)
Sodium: 139 mmol/L (ref 135–145)

## 2020-03-24 LAB — MAGNESIUM: Magnesium: 2 mg/dL (ref 1.7–2.4)

## 2020-03-24 MED ORDER — PALONOSETRON HCL INJECTION 0.25 MG/5ML
0.2500 mg | Freq: Once | INTRAVENOUS | Status: AC
  Administered 2020-03-24: 0.25 mg via INTRAVENOUS
  Filled 2020-03-24: qty 5

## 2020-03-24 MED ORDER — SODIUM CHLORIDE 0.9 % IV SOLN
Freq: Once | INTRAVENOUS | Status: AC
  Filled 2020-03-24: qty 1000

## 2020-03-24 MED ORDER — SODIUM CHLORIDE 0.9 % IV SOLN
10.0000 mg | Freq: Once | INTRAVENOUS | Status: AC
  Administered 2020-03-24: 10 mg via INTRAVENOUS
  Filled 2020-03-24: qty 10

## 2020-03-24 MED ORDER — SODIUM CHLORIDE 0.9 % IV SOLN
150.0000 mg | Freq: Once | INTRAVENOUS | Status: AC
  Administered 2020-03-24: 150 mg via INTRAVENOUS
  Filled 2020-03-24: qty 150

## 2020-03-24 MED ORDER — SODIUM CHLORIDE 0.9 % IV SOLN
100.0000 mg | Freq: Once | INTRAVENOUS | Status: AC
  Administered 2020-03-24: 100 mg via INTRAVENOUS
  Filled 2020-03-24: qty 100

## 2020-03-24 MED ORDER — SODIUM CHLORIDE 0.9 % IV SOLN
Freq: Once | INTRAVENOUS | Status: AC
  Filled 2020-03-24: qty 250

## 2020-03-24 NOTE — Progress Notes (Signed)
Patient here for oncology follow-up appointment, expresses a list of concerns, patient stated he will present to MD.

## 2020-03-24 NOTE — Progress Notes (Signed)
Patient tolerated infusion well. Encouraged patient to contact clinic for any questions or concerns. Patient verbalizes understanding and denies any further questions or concerns. Patient and VSS. Discharged home.

## 2020-03-25 ENCOUNTER — Telehealth: Payer: Self-pay

## 2020-03-25 ENCOUNTER — Ambulatory Visit
Admission: RE | Admit: 2020-03-25 | Discharge: 2020-03-25 | Disposition: A | Discharge status: Home / Self Care | Payer: Managed Care, Other (non HMO) | Source: Ambulatory Visit | Attending: Radiation Oncology | Admitting: Radiation Oncology

## 2020-03-25 ENCOUNTER — Encounter: Payer: Self-pay | Admitting: Oncology

## 2020-03-25 DIAGNOSIS — Z510 Encounter for antineoplastic radiation therapy: Secondary | ICD-10-CM | POA: Diagnosis not present

## 2020-03-25 NOTE — Telephone Encounter (Signed)
Telephone call to patient for follow up after receiving first infusion.   Patient states infusion went great.  States eating good and drinking plenty of fluids.   Denies any nausea or vomiting.  Encouraged patient to call for any concerns or questions. 

## 2020-03-26 ENCOUNTER — Ambulatory Visit
Admission: RE | Admit: 2020-03-26 | Discharge: 2020-03-26 | Disposition: A | Discharge status: Home / Self Care | Payer: Managed Care, Other (non HMO) | Source: Ambulatory Visit | Attending: Radiation Oncology | Admitting: Radiation Oncology

## 2020-03-26 DIAGNOSIS — Z510 Encounter for antineoplastic radiation therapy: Secondary | ICD-10-CM | POA: Diagnosis not present

## 2020-03-27 NOTE — Progress Notes (Signed)
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  INTERVAL HISTORY: Patient returns to clinic today for further evaluation and consideration of cycle two of weekly cisplatin. He is tolerating his treatments well without significant side effects. He currently feels well and is asymptomatic. He has no neurologic complaints. He denies any recent fevers or illnesses. He has a good appetite and denies weight loss. He denies any dysphagia or neck pain. He has no chest pain, shortness of breath, cough, or hemoptysis. He denies any nausea, vomiting, constipation, or diarrhea. He has no urinary complaints. Patient offers no specific complaints today.  REVIEW OF SYSTEMS:   Review of Systems  Constitutional: Negative.  Negative for fever, malaise/fatigue and weight loss.  Respiratory: Negative.  Negative for cough and shortness of breath.   Cardiovascular: Negative.  Negative for chest pain and leg swelling.  Gastrointestinal: Negative.  Negative for abdominal pain.  Genitourinary: Negative.  Negative for dysuria.  Musculoskeletal: Negative.  Negative for back pain.  Skin: Negative.  Negative for rash.  Neurological: Negative.  Negative for dizziness, focal weakness, weakness and headaches.  Psychiatric/Behavioral: Negative.  The patient is not nervous/anxious.     As per HPI. Otherwise, a complete review of systems is negative.  PAST MEDICAL HISTORY: Past Medical History:  Diagnosis Date  . Arthritis    "right index" (03/28/2016)  . B12 deficiency   . Basal cell carcinoma of right side of nose   . Basal cell carcinoma of skin of nose    top of bridge  . Chronic back pain    "lower back and cervical  spine" (03/28/2016)  . Depression    "stress related" (03/18/2016)  . GERD (gastroesophageal reflux disease)   . History of kidney stones   . Iron deficiency anemia   . Normocytic anemia   . OSA (obstructive sleep apnea)    no cpap after weight loss (03/28/2016)  . Seasonal allergies   . Ventral hernia     PAST SURGICAL HISTORY: Past Surgical History:  Procedure Laterality Date  . AIR/FLUID EXCHANGE Left 03/28/2016   Procedure: AIR/FLUID EXCHANGE;  Surgeon: Hayden Pedro, MD;  Location: [place];  Service: Ophthalmology;  Laterality: Left;  . APPENDECTOMY  2001  . BASAL CELL CARCINOMA EXCISION     "bridge of nose and right side of nose at crease"  . CATARACT EXTRACTION W/ INTRAOCULAR LENS  IMPLANT, BILATERAL Bilateral 2013-2015   left-right  . CHOLECYSTECTOMY OPEN  2001  . EXTRACORPOREAL SHOCK WAVE LITHOTRIPSY  "4-5 times"  . EYE SURGERY    . PARS PLANA VITRECTOMY Left 03/29/2016   WITH 25G REMOVAL/SUTURE INTRAOCULAR LENS   . PARS PLANA VITRECTOMY Left 03/28/2016   Procedure: PARS PLANA VITRECTOMY WITH 25G REMOVAL/SUTURE INTRAOCULAR LENS;  Surgeon: Hayden Pedro, MD;  Location: [place];  Service: Ophthalmology;  Laterality: Left;  . ROUX-EN-Y GASTRIC BYPASS  2001  . SEPTOPLASTY  1986  . TONSILLECTOMY AND ADENOIDECTOMY  1965    FAMILY HISTORY: Family History  Problem Relation Age of Onset  . Diabetes Other   . Heart disease Other   . Anemia Other   . Brain cancer Other     ADVANCED DIRECTIVES (Y/N):  N  HEALTH MAINTENANCE: Social History   Tobacco  Use  . Smoking status: Never Smoker  . Smokeless tobacco: Never Used  Vaping Use  . Vaping Use: Never used  Substance Use Topics  . Alcohol use: Yes    Comment: 03/28/2016 "might have a drink a couple times/month"  . Drug use: No     Colonoscopy:  PAP:  Bone density:  Lipid panel:  Allergies  Allergen Reactions  . Penicillins Hives    Current Outpatient Medications  Medication Sig Dispense Refill  .  Ascorbic Acid (VITAMIN C) 1000 MG tablet Take 1,000 mg by mouth daily.    . brimonidine (ALPHAGAN) 0.15 % ophthalmic solution Place 1 drop into the left eye 2 (two) times daily.    . Cholecalciferol (VITAMIN D) 50 MCG (2000 UT) tablet Take 2,000 Units by mouth daily.    . Cyanocobalamin (B-12) 5000 MCG CAPS Take 1 capsule by mouth daily.    . cyclobenzaprine (FLEXERIL) 10 MG tablet   3  . Diclofenac-miSOPROStol 50-0.2 MG TBEC Take 1 tablet by mouth 2 (two) times daily as needed.    . dicyclomine (BENTYL) 20 MG tablet Take 20 mg by mouth every 6 (six) hours.    . famotidine (PEPCID) 40 MG tablet Take 40 mg by mouth daily.     . ferrous sulfate 325 (65 FE) MG EC tablet Take 325 mg by mouth daily.     . Multiple Vitamin (MULTI-VITAMINS) TABS Take 1 tablet by mouth daily.     . ondansetron (ZOFRAN) 8 MG tablet Take 1 tablet (8 mg total) by mouth every 8 (eight) hours as needed for nausea or vomiting. 30 tablet 1  . prochlorperazine (COMPAZINE) 10 MG tablet Take 1 tablet (10 mg total) by mouth every 6 (six) hours as needed (Nausea or vomiting). 60 tablet 2  . rosuvastatin (CRESTOR) 5 MG tablet Take 5 mg by mouth daily.    Marland Kitchen venlafaxine XR (EFFEXOR-XR) 37.5 MG 24 hr capsule Take 37.5 mg by mouth daily.     No current facility-administered medications for this visit.    OBJECTIVE: Vitals:   03/31/20 0856  BP: 113/84  Pulse: 66  Resp: 18  SpO2: 100%     Body mass index is 31.9 kg/m.    ECOG FS:0 - Asymptomatic  General: Well-developed, well-nourished, no acute distress. Eyes: Pink conjunctiva, anicteric sclera. HEENT: Normocephalic, moist mucous membranes. Lungs: No audible wheezing or coughing. Heart: Regular rate and rhythm. Abdomen: Soft, nontender, no obvious distention. Musculoskeletal: No edema, cyanosis, or clubbing. Neuro: Alert, answering all questions appropriately. Cranial nerves grossly intact. Skin: No rashes or petechiae noted. Psych: Normal affect.   LAB RESULTS:  Lab  Results  Component Value Date   NA 140 03/31/2020   K 4.7 03/31/2020   CL 106 03/31/2020   CO2 26 03/31/2020   GLUCOSE 53 (L) 03/31/2020   BUN 21 03/31/2020   CREATININE 1.08 03/31/2020   CALCIUM 8.8 (L) 03/31/2020   PROT 6.4 03/29/2018   ALBUMIN 4.4 03/29/2018   AST 26 03/29/2018   ALT 25 03/29/2018   ALKPHOS 50 03/29/2018   BILITOT 0.5 03/29/2018   GFRNONAA >60 03/31/2020   GFRAA >60 07/26/2015    Lab Results  Component Value Date   WBC 5.0 03/31/2020   NEUTROABS 3.0 03/31/2020   HGB 12.8 (L) 03/31/2020   HCT 37.4 (L) 03/31/2020   MCV 97.1 03/31/2020   PLT 189 03/31/2020     STUDIES: NM PET Image Initial (PI) Skull Base To Thigh  Result Date: 03/04/2020 CLINICAL DATA:  Initial treatment strategy for neck mass biopsied found to have squamous cell carcinoma in the LEFT parotid gland. EXAM: NUCLEAR MEDICINE PET SKULL BASE TO THIGH TECHNIQUE: 13.3 mCi F-18 FDG was injected intravenously. Full-ring PET imaging was performed from the skull base to thigh after the radiotracer. CT data was obtained and used for attenuation correction and anatomic localization. Fasting blood glucose: 83 mg/dl COMPARISON:  CT neck of 01/30/2020 FINDINGS: Mediastinal blood pool activity: SUV max 2.75 Liver activity: SUV max NA NECK: LEFT parotid mass that measures 1.9 by 1.8 cm (image 67 of series 3) (SUVmax = 14.4) No additional areas of focal uptake within the neck. Incidental CT findings: none CHEST: No hypermetabolic mediastinal or hilar nodes. No suspicious pulmonary nodules on the CT scan. Incidental CT findings: Calcified coronary artery disease. Calcification of LEFT and RIGHT coronary circulation. No pericardial effusion. Heart size is normal. Aortic caliber is normal. Central pulmonary vasculature with normal caliber. Limited assessment of cardiovascular structures given lack of intravenous contrast. Elevated LEFT hemidiaphragm. Minimal LEFT lower lobe atelectasis. No effusion. No chest wall mass.  ABDOMEN/PELVIS: No abnormal hypermetabolic activity within the liver, pancreas, adrenal glands, or spleen. No hypermetabolic lymph nodes in the abdomen or pelvis. Incidental CT findings: Post cholecystectomy. Liver with normal contour. Spleen with normal size and contour. Pancreas, adrenal glands and kidneys without acute process. Post gastric bypass procedure. No acute gastrointestinal process. Calcified atheromatous plaque of the abdominal aorta. No abdominal aortic aneurysm. 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 Moderate fat containing RIGHT inguinal hernia. Prostatomegaly. SKELETON: No focal hypermetabolic activity to suggest skeletal metastasis. Incidental CT findings: Spinal degenerative changes. No acute or destructive bone finding. IMPRESSION: 1. Hypermetabolic LEFT parotid mass corresponding to abnormality seen on previous CT without additional areas of uptake in the neck, chest, abdomen or pelvis to indicate additional sites of involvement. 2. Incidental findings of atherosclerosis, coronary artery disease and postoperative changes of gastric bypass procedure. 3. Slightly enlarged small splenic artery aneurysm with peripheral calcification, attention on follow-up. Aortic Atherosclerosis (ICD10-I70.0). Electronically Signed   By: Zetta Bills M.D.   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  PLAN:    1. 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 PET scan results from March 04, 2020 reviewed independently with no hypermetabolic activity outside the parotid gland. Given the location of the facial nerve, would like to avoid surgery if possible. Plan to give weekly cisplatin 40 mg/m2 along with daily XRT for 5-8 cycles. Patient has declined port placement at this time. Continue daily XRT. Proceed with cycle two  of treatment today. Return to clinic in 1 week for further evaluation and consideration of cycle three.   I spent a total of 30 minutes reviewing chart data, face-to-face evaluation with the patient, counseling and coordination of care as detailed above.    Patient expressed understanding and was in agreement with this plan. He also understands that He can call clinic at any time with any questions, concerns, or complaints.   Cancer Staging Squamous cell carcinoma of parotid [facility]) Staging form: Major Salivary Glands, AJCC 8th Edition - Clinical stage from 03/05/2020: Stage II (cT2, cN0, cM0) - Signed by Lloyd Huger, MD on 03/05/2020   Lloyd Huger, MD   04/03/2020 7:06 AM

## 2020-03-29 ENCOUNTER — Ambulatory Visit
Admission: RE | Admit: 2020-03-29 | Discharge: 2020-03-29 | Disposition: A | Discharge status: Home / Self Care | Payer: Managed Care, Other (non HMO) | Source: Ambulatory Visit | Attending: Radiation Oncology | Admitting: Radiation Oncology

## 2020-03-29 DIAGNOSIS — Z510 Encounter for antineoplastic radiation therapy: Secondary | ICD-10-CM | POA: Diagnosis not present

## 2020-03-30 ENCOUNTER — Ambulatory Visit
Admission: RE | Admit: 2020-03-30 | Discharge: 2020-03-30 | Disposition: A | Discharge status: Home / Self Care | Payer: Managed Care, Other (non HMO) | Source: Ambulatory Visit | Attending: Radiation Oncology | Admitting: Radiation Oncology

## 2020-03-30 ENCOUNTER — Encounter: Payer: Self-pay | Admitting: Oncology

## 2020-03-30 DIAGNOSIS — Z510 Encounter for antineoplastic radiation therapy: Secondary | ICD-10-CM | POA: Diagnosis not present

## 2020-03-31 ENCOUNTER — Inpatient Hospital Stay: Payer: Managed Care, Other (non HMO)

## 2020-03-31 ENCOUNTER — Inpatient Hospital Stay (HOSPITAL_BASED_OUTPATIENT_CLINIC_OR_DEPARTMENT_OTHER): Payer: Managed Care, Other (non HMO) | Admitting: Oncology

## 2020-03-31 ENCOUNTER — Ambulatory Visit
Admission: RE | Admit: 2020-03-31 | Discharge: 2020-03-31 | Disposition: A | Discharge status: Home / Self Care | Payer: Managed Care, Other (non HMO) | Source: Ambulatory Visit | Attending: Radiation Oncology | Admitting: Radiation Oncology

## 2020-03-31 ENCOUNTER — Other Ambulatory Visit: Payer: Self-pay

## 2020-03-31 VITALS — BP 113/84 | HR 66 | Resp 18 | Wt 255.2 lb

## 2020-03-31 VITALS — Temp 97.1°F

## 2020-03-31 DIAGNOSIS — Z510 Encounter for antineoplastic radiation therapy: Secondary | ICD-10-CM | POA: Diagnosis not present

## 2020-03-31 DIAGNOSIS — C07 Malignant neoplasm of parotid gland: Secondary | ICD-10-CM

## 2020-03-31 LAB — BASIC METABOLIC PANEL
Anion gap: 8 (ref 5–15)
BUN: 21 mg/dL (ref 8–23)
CO2: 26 mmol/L (ref 22–32)
Calcium: 8.8 mg/dL — ABNORMAL LOW (ref 8.9–10.3)
Chloride: 106 mmol/L (ref 98–111)
Creatinine, Ser: 1.08 mg/dL (ref 0.61–1.24)
GFR, Estimated: >60 mL/min (ref >60)
Glucose, Bld: 53 mg/dL — ABNORMAL LOW (ref 70–99)
Potassium: 4.7 mmol/L (ref 3.5–5.1)
Sodium: 140 mmol/L (ref 135–145)

## 2020-03-31 LAB — CBC WITH DIFFERENTIAL/PLATELET
Abs Immature Granulocytes: 0.02 10*3/uL (ref 0.00–0.07)
Basophils Absolute: 0.1 10*3/uL (ref 0.0–0.1)
Basophils Relative: 2 %
Eosinophils Absolute: 0.1 10*3/uL (ref 0.0–0.5)
Eosinophils Relative: 2 %
HCT: 37.4 % — ABNORMAL LOW (ref 39.0–52.0)
Hemoglobin: 12.8 g/dL — ABNORMAL LOW (ref 13.0–17.0)
Immature Granulocytes: 0 %
Lymphocytes Relative: 26 %
Lymphs Abs: 1.3 10*3/uL (ref 0.7–4.0)
MCH: 33.2 pg (ref 26.0–34.0)
MCHC: 34.2 g/dL (ref 30.0–36.0)
MCV: 97.1 fL (ref 80.0–100.0)
Monocytes Absolute: 0.5 10*3/uL (ref 0.1–1.0)
Monocytes Relative: 10 %
Neutro Abs: 3 10*3/uL (ref 1.7–7.7)
Neutrophils Relative %: 60 %
Platelets: 189 10*3/uL (ref 150–400)
RBC: 3.85 MIL/uL — ABNORMAL LOW (ref 4.22–5.81)
RDW: 12.8 % (ref 11.5–15.5)
WBC: 5 10*3/uL (ref 4.0–10.5)
nRBC: 0 % (ref 0.0–0.2)

## 2020-03-31 LAB — MAGNESIUM: Magnesium: 2.1 mg/dL (ref 1.7–2.4)

## 2020-03-31 MED ORDER — SODIUM CHLORIDE 0.9 % IV SOLN
Freq: Once | INTRAVENOUS | Status: AC
  Filled 2020-03-31: qty 250

## 2020-03-31 MED ORDER — SODIUM CHLORIDE 0.9 % IV SOLN
Freq: Once | INTRAVENOUS | Status: AC
  Filled 2020-03-31: qty 10

## 2020-03-31 MED ORDER — SODIUM CHLORIDE 0.9% FLUSH
10.0000 mL | INTRAVENOUS | Status: DC | PRN
  Filled 2020-03-31: qty 10

## 2020-03-31 MED ORDER — PALONOSETRON HCL INJECTION 0.25 MG/5ML
0.2500 mg | Freq: Once | INTRAVENOUS | Status: AC
  Administered 2020-03-31: 0.25 mg via INTRAVENOUS

## 2020-03-31 MED ORDER — SODIUM CHLORIDE 0.9 % IV SOLN
10.0000 mg | Freq: Once | INTRAVENOUS | Status: AC
  Administered 2020-03-31: 10 mg via INTRAVENOUS
  Filled 2020-03-31: qty 10

## 2020-03-31 MED ORDER — SODIUM CHLORIDE 0.9 % IV SOLN
100.0000 mg | Freq: Once | INTRAVENOUS | Status: AC
  Administered 2020-03-31: 100 mg via INTRAVENOUS
  Filled 2020-03-31: qty 100

## 2020-03-31 MED ORDER — SODIUM CHLORIDE 0.9 % IV SOLN
150.0000 mg | Freq: Once | INTRAVENOUS | Status: AC
  Administered 2020-03-31: 150 mg via INTRAVENOUS
  Filled 2020-03-31: qty 150

## 2020-04-01 ENCOUNTER — Ambulatory Visit
Admission: RE | Admit: 2020-04-01 | Discharge: 2020-04-01 | Disposition: A | Discharge status: Home / Self Care | Payer: Managed Care, Other (non HMO) | Source: Ambulatory Visit | Attending: Radiation Oncology | Admitting: Radiation Oncology

## 2020-04-01 DIAGNOSIS — Z510 Encounter for antineoplastic radiation therapy: Secondary | ICD-10-CM | POA: Diagnosis not present

## 2020-04-02 ENCOUNTER — Ambulatory Visit
Admission: RE | Admit: 2020-04-02 | Discharge: 2020-04-02 | Disposition: A | Discharge status: Home / Self Care | Payer: Managed Care, Other (non HMO) | Source: Ambulatory Visit | Attending: Radiation Oncology | Admitting: Radiation Oncology

## 2020-04-02 DIAGNOSIS — Z510 Encounter for antineoplastic radiation therapy: Secondary | ICD-10-CM | POA: Diagnosis not present

## 2020-04-03 NOTE — Progress Notes (Signed)
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  INTERVAL HISTORY: Patient returns to clinic today for further evaluation and consideration of cycle 3 of weekly cisplatin.  He noticed increased nausea this past week that was well controlled with his current medications, but otherwise feels well. He has no neurologic complaints. He denies any recent fevers or illnesses. He has a good appetite and denies weight loss. He denies any dysphagia or neck pain. He has no chest pain, shortness of breath, cough, or hemoptysis. He denies any nausea, vomiting, constipation, or diarrhea. He has no urinary complaints.  Patient offers no further specific complaints today.  REVIEW OF SYSTEMS:   Review of Systems  Constitutional: Negative.  Negative for fever, malaise/fatigue and weight loss.  Respiratory: Negative.  Negative for cough and shortness of breath.   Cardiovascular: Negative.  Negative for chest pain and leg swelling.  Gastrointestinal: Positive for nausea. Negative for abdominal pain.  Genitourinary: Negative.  Negative for dysuria.  Musculoskeletal: Negative.  Negative for back pain.  Skin: Negative.  Negative for rash.  Neurological: Negative.  Negative for dizziness, focal weakness, weakness and headaches.  Psychiatric/Behavioral: Negative.  The patient is not nervous/anxious.     As per HPI. Otherwise, a complete review of systems is negative.  PAST MEDICAL HISTORY: Past Medical History:  Diagnosis Date  . Arthritis    "right index" (03/28/2016)  . B12 deficiency   . Basal cell carcinoma of right side of nose   . Basal cell carcinoma of skin of nose    top of bridge  . Chronic back pain     "lower back and cervical spine" (03/28/2016)  . Depression    "stress related" (03/18/2016)  . GERD (gastroesophageal reflux disease)   . History of kidney stones   . Iron deficiency anemia   . Normocytic anemia   . OSA (obstructive sleep apnea)    no cpap after weight loss (03/28/2016)  . Seasonal allergies   . Ventral hernia     PAST SURGICAL HISTORY: Past Surgical History:  Procedure Laterality Date  . AIR/FLUID EXCHANGE Left 03/28/2016   Procedure: AIR/FLUID EXCHANGE;  Surgeon: Hayden Pedro, MD;  Location: [place];  Service: Ophthalmology;  Laterality: Left;  . APPENDECTOMY  2001  . BASAL CELL CARCINOMA EXCISION     "bridge of nose and right side of nose at crease"  . CATARACT EXTRACTION W/ INTRAOCULAR LENS  IMPLANT, BILATERAL Bilateral 2013-2015   left-right  . CHOLECYSTECTOMY OPEN  2001  . EXTRACORPOREAL SHOCK WAVE LITHOTRIPSY  "4-5 times"  . EYE SURGERY    . PARS PLANA VITRECTOMY Left 03/29/2016   WITH 25G REMOVAL/SUTURE INTRAOCULAR LENS   . PARS PLANA VITRECTOMY Left 03/28/2016   Procedure: PARS PLANA VITRECTOMY WITH 25G REMOVAL/SUTURE INTRAOCULAR LENS;  Surgeon: Hayden Pedro, MD;  Location: [place];  Service: Ophthalmology;  Laterality: Left;  . ROUX-EN-Y GASTRIC BYPASS  2001  . SEPTOPLASTY  1986  . TONSILLECTOMY AND ADENOIDECTOMY  1965    FAMILY HISTORY: Family History  Problem Relation Age of Onset  . Diabetes Other   . Heart disease Other   . Anemia Other   . Brain cancer Other     ADVANCED DIRECTIVES (Y/N):  N  HEALTH  MAINTENANCE: Social History   Tobacco Use  . Smoking status: Never Smoker  . Smokeless tobacco: Never Used  Vaping Use  . Vaping Use: Never used  Substance Use Topics  . Alcohol use: Yes    Comment: 03/28/2016 "might have a drink a couple times/month"  . Drug use: No     Colonoscopy:  PAP:  Bone density:  Lipid panel:  Allergies  Allergen Reactions  . Penicillins Hives    Current Outpatient Medications  Medication Sig  Dispense Refill  . Ascorbic Acid (VITAMIN C) 1000 MG tablet Take 1,000 mg by mouth daily.    . brimonidine (ALPHAGAN) 0.15 % ophthalmic solution Place 1 drop into the left eye 2 (two) times daily.    . Cholecalciferol (VITAMIN D) 50 MCG (2000 UT) tablet Take 2,000 Units by mouth daily.    . Cyanocobalamin (B-12) 5000 MCG CAPS Take 1 capsule by mouth daily.    . cyclobenzaprine (FLEXERIL) 10 MG tablet   3  . Diclofenac-miSOPROStol 50-0.2 MG TBEC Take 1 tablet by mouth 2 (two) times daily as needed.    . dicyclomine (BENTYL) 20 MG tablet Take 20 mg by mouth every 6 (six) hours.    . famotidine (PEPCID) 40 MG tablet Take 40 mg by mouth daily.     . ferrous sulfate 325 (65 FE) MG EC tablet Take 325 mg by mouth daily.     . Multiple Vitamin (MULTI-VITAMINS) TABS Take 1 tablet by mouth daily.     . ondansetron (ZOFRAN) 8 MG tablet Take 1 tablet (8 mg total) by mouth every 8 (eight) hours as needed for nausea or vomiting. 30 tablet 1  . prochlorperazine (COMPAZINE) 10 MG tablet Take 1 tablet (10 mg total) by mouth every 6 (six) hours as needed (Nausea or vomiting). 60 tablet 2  . rosuvastatin (CRESTOR) 5 MG tablet Take 5 mg by mouth daily.    Marland Kitchen venlafaxine XR (EFFEXOR-XR) 37.5 MG 24 hr capsule Take 37.5 mg by mouth daily.     No current facility-administered medications for this visit.   Facility-Administered Medications Ordered in Other Visits  Medication Dose Route Frequency Provider Last Rate Last Admin  . CISplatin (PLATINOL) 100 mg in sodium chloride 0.9 % 500 mL chemo infusion  100 mg Intravenous Once Lloyd Huger, MD      . dexamethasone (DECADRON) 10 mg in sodium chloride 0.9 % 50 mL IVPB  10 mg Intravenous Once Lloyd Huger, MD      . fosaprepitant (EMEND) 150 mg in sodium chloride 0.9 % 145 mL IVPB  150 mg Intravenous Once Lloyd Huger, MD      . heparin lock flush 100 unit/mL  500 Units Intracatheter Once PRN Lloyd Huger, MD      . palonosetron (ALOXI)  injection 0.25 mg  0.25 mg Intravenous Once Lloyd Huger, MD        OBJECTIVE: Vitals:   04/06/20 0900  BP: 106/73  Pulse: 62  Temp: 97.8 F (36.6 C)  SpO2: 100%     Body mass index is 32.14 kg/m.    ECOG FS:0 - Asymptomatic  General: Well-developed, well-nourished, no acute distress. Eyes: Pink conjunctiva, anicteric sclera. HEENT: Normocephalic, moist mucous membranes. Lungs: No audible wheezing or coughing. Heart: Regular rate and rhythm. Abdomen: Soft, nontender, no obvious distention. Musculoskeletal: No edema, cyanosis, or clubbing. Neuro: Alert, answering all questions appropriately. Cranial nerves grossly intact. Skin: No rashes or petechiae noted. Psych: Normal affect.   LAB RESULTS:  Lab Results  Component Value Date   NA 138 04/06/2020   K 4.8 04/06/2020   CL 103 04/06/2020   CO2 26 04/06/2020   GLUCOSE 51 (L) 04/06/2020   BUN 20 04/06/2020   CREATININE 0.96 04/06/2020   CALCIUM 8.6 (L) 04/06/2020   PROT 6.4 03/29/2018   ALBUMIN 4.4 03/29/2018   AST 26 03/29/2018   ALT 25 03/29/2018   ALKPHOS 50 03/29/2018   BILITOT 0.5 03/29/2018   GFRNONAA >60 04/06/2020   GFRAA >60 07/26/2015    Lab Results  Component Value Date   WBC 5.9 04/06/2020   NEUTROABS 4.0 04/06/2020   HGB 12.4 (L) 04/06/2020   HCT 36.0 (L) 04/06/2020   MCV 95.5 04/06/2020   PLT 176 04/06/2020     STUDIES: No results found.  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  PLAN:    1. 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 PET scan results from March 04, 2020 reviewed independently with no hypermetabolic activity outside the parotid gland. Given the location of the facial nerve, would like to avoid surgery if possible. Plan to give weekly cisplatin 40 mg/m2 along with daily XRT for 5-8 cycles. Patient has declined port placement at this time. Continue daily XRT.  Proceed with cycle 3 of treatment today.   Return to clinic in 1 week for further evaluation and consideration of cycle 4. 2.  Nausea: Continue current antiemetics as prescribed.  I spent a total of 30 minutes reviewing chart data, face-to-face evaluation with the patient, counseling and coordination of care as detailed above.    Patient expressed understanding and was in agreement with this plan. He also understands that He can call clinic at any time with any questions, concerns, or complaints.   Cancer Staging Squamous cell carcinoma of parotid [facility]) Staging form: Major Salivary Glands, AJCC 8th Edition - Clinical stage from 03/05/2020: Stage II (cT2, cN0, cM0) - Signed by Lloyd Huger, MD on 03/05/2020   Lloyd Huger, MD   04/06/2020 12:20 PM

## 2020-04-05 ENCOUNTER — Ambulatory Visit
Admission: RE | Admit: 2020-04-05 | Discharge: 2020-04-05 | Disposition: A | Discharge status: Home / Self Care | Payer: Managed Care, Other (non HMO) | Source: Ambulatory Visit | Attending: Radiation Oncology | Admitting: Radiation Oncology

## 2020-04-05 ENCOUNTER — Encounter: Payer: Self-pay | Admitting: Oncology

## 2020-04-05 DIAGNOSIS — Z510 Encounter for antineoplastic radiation therapy: Secondary | ICD-10-CM | POA: Diagnosis not present

## 2020-04-06 ENCOUNTER — Inpatient Hospital Stay: Payer: Managed Care, Other (non HMO)

## 2020-04-06 ENCOUNTER — Inpatient Hospital Stay (HOSPITAL_BASED_OUTPATIENT_CLINIC_OR_DEPARTMENT_OTHER): Payer: Managed Care, Other (non HMO) | Admitting: Oncology

## 2020-04-06 ENCOUNTER — Ambulatory Visit
Admission: RE | Admit: 2020-04-06 | Discharge: 2020-04-06 | Disposition: A | Discharge status: Home / Self Care | Payer: Managed Care, Other (non HMO) | Source: Ambulatory Visit | Attending: Radiation Oncology | Admitting: Radiation Oncology

## 2020-04-06 VITALS — BP 106/73 | HR 62 | Temp 97.8°F | Wt 257.1 lb

## 2020-04-06 DIAGNOSIS — Z510 Encounter for antineoplastic radiation therapy: Secondary | ICD-10-CM | POA: Diagnosis not present

## 2020-04-06 DIAGNOSIS — C07 Malignant neoplasm of parotid gland: Secondary | ICD-10-CM | POA: Diagnosis not present

## 2020-04-06 LAB — BASIC METABOLIC PANEL
Anion gap: 9 (ref 5–15)
BUN: 20 mg/dL (ref 8–23)
CO2: 26 mmol/L (ref 22–32)
Calcium: 8.6 mg/dL — ABNORMAL LOW (ref 8.9–10.3)
Chloride: 103 mmol/L (ref 98–111)
Creatinine, Ser: 0.96 mg/dL (ref 0.61–1.24)
GFR, Estimated: >60 mL/min (ref >60)
Glucose, Bld: 51 mg/dL — ABNORMAL LOW (ref 70–99)
Potassium: 4.8 mmol/L (ref 3.5–5.1)
Sodium: 138 mmol/L (ref 135–145)

## 2020-04-06 LAB — CBC WITH DIFFERENTIAL/PLATELET
Abs Immature Granulocytes: 0.01 10*3/uL (ref 0.00–0.07)
Basophils Absolute: 0.1 10*3/uL (ref 0.0–0.1)
Basophils Relative: 1 %
Eosinophils Absolute: 0.1 10*3/uL (ref 0.0–0.5)
Eosinophils Relative: 2 %
HCT: 36 % — ABNORMAL LOW (ref 39.0–52.0)
Hemoglobin: 12.4 g/dL — ABNORMAL LOW (ref 13.0–17.0)
Immature Granulocytes: 0 %
Lymphocytes Relative: 21 %
Lymphs Abs: 1.2 10*3/uL (ref 0.7–4.0)
MCH: 32.9 pg (ref 26.0–34.0)
MCHC: 34.4 g/dL (ref 30.0–36.0)
MCV: 95.5 fL (ref 80.0–100.0)
Monocytes Absolute: 0.5 10*3/uL (ref 0.1–1.0)
Monocytes Relative: 8 %
Neutro Abs: 4 10*3/uL (ref 1.7–7.7)
Neutrophils Relative %: 68 %
Platelets: 176 10*3/uL (ref 150–400)
RBC: 3.77 MIL/uL — ABNORMAL LOW (ref 4.22–5.81)
RDW: 12.9 % (ref 11.5–15.5)
WBC: 5.9 10*3/uL (ref 4.0–10.5)
nRBC: 0 % (ref 0.0–0.2)

## 2020-04-06 LAB — MAGNESIUM: Magnesium: 2.1 mg/dL (ref 1.7–2.4)

## 2020-04-06 MED ORDER — PALONOSETRON HCL INJECTION 0.25 MG/5ML
0.2500 mg | Freq: Once | INTRAVENOUS | Status: AC
  Administered 2020-04-06: 0.25 mg via INTRAVENOUS
  Filled 2020-04-06: qty 5

## 2020-04-06 MED ORDER — SODIUM CHLORIDE 0.9 % IV SOLN
100.0000 mg | Freq: Once | INTRAVENOUS | Status: AC
  Administered 2020-04-06: 100 mg via INTRAVENOUS
  Filled 2020-04-06: qty 100

## 2020-04-06 MED ORDER — SODIUM CHLORIDE 0.9 % IV SOLN
Freq: Once | INTRAVENOUS | Status: AC
  Filled 2020-04-06: qty 1000

## 2020-04-06 MED ORDER — SODIUM CHLORIDE 0.9 % IV SOLN
10.0000 mg | Freq: Once | INTRAVENOUS | Status: AC
  Administered 2020-04-06: 10 mg via INTRAVENOUS
  Filled 2020-04-06: qty 10

## 2020-04-06 MED ORDER — HEPARIN SOD (PORK) LOCK FLUSH 100 UNIT/ML IV SOLN
500.0000 [IU] | Freq: Once | INTRAVENOUS | Status: DC | PRN
  Filled 2020-04-06: qty 5

## 2020-04-06 MED ORDER — HEPARIN SOD (PORK) LOCK FLUSH 100 UNIT/ML IV SOLN
INTRAVENOUS | Status: AC
  Filled 2020-04-06: qty 5

## 2020-04-06 MED ORDER — SODIUM CHLORIDE 0.9 % IV SOLN
Freq: Once | INTRAVENOUS | Status: AC
  Filled 2020-04-06: qty 250

## 2020-04-06 MED ORDER — SODIUM CHLORIDE 0.9 % IV SOLN
150.0000 mg | Freq: Once | INTRAVENOUS | Status: AC
  Administered 2020-04-06: 150 mg via INTRAVENOUS
  Filled 2020-04-06: qty 150

## 2020-04-06 NOTE — Progress Notes (Signed)
Pt received cisplatin tx today per order.Tolerated well.

## 2020-04-07 ENCOUNTER — Ambulatory Visit
Admission: RE | Admit: 2020-04-07 | Discharge: 2020-04-07 | Disposition: A | Discharge status: Home / Self Care | Payer: Managed Care, Other (non HMO) | Source: Ambulatory Visit | Attending: Radiation Oncology | Admitting: Radiation Oncology

## 2020-04-07 ENCOUNTER — Other Ambulatory Visit: Payer: Managed Care, Other (non HMO)

## 2020-04-07 ENCOUNTER — Ambulatory Visit: Payer: Managed Care, Other (non HMO)

## 2020-04-07 ENCOUNTER — Ambulatory Visit: Payer: Managed Care, Other (non HMO) | Admitting: Oncology

## 2020-04-07 DIAGNOSIS — Z510 Encounter for antineoplastic radiation therapy: Secondary | ICD-10-CM | POA: Diagnosis not present

## 2020-04-07 NOTE — Progress Notes (Signed)
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  INTERVAL HISTORY: Patient returns to clinic today for further evaluation and consideration of cycle 4 of weekly cisplatin.  He has increased pain as well as numbness on the left side of his face.  He also complains of mild worsening of his tinnitus.  He otherwise feels well.  He has no neurologic complaints. He denies any recent fevers or illnesses. He has a good appetite and denies weight loss.  He denies any dysphagia.  He has no chest pain, shortness of breath, cough, or hemoptysis. He denies any nausea, vomiting, constipation, or diarrhea. He has no urinary complaints.  Patient offers no further specific complaints today.  REVIEW OF SYSTEMS:   Review of Systems  Constitutional: Negative.  Negative for fever, malaise/fatigue and weight loss.  HENT: Positive for tinnitus.   Respiratory: Negative.  Negative for cough and shortness of breath.   Cardiovascular: Negative.  Negative for chest pain and leg swelling.  Gastrointestinal: Negative.  Negative for abdominal pain and nausea.  Genitourinary: Negative.  Negative for dysuria.  Musculoskeletal: Negative.  Negative for back pain.  Skin: Negative.  Negative for rash.  Neurological: Negative.  Negative for dizziness, focal weakness, weakness and headaches.  Psychiatric/Behavioral: Negative.  The patient is not nervous/anxious.     As per HPI. Otherwise, a complete review of systems is negative.  PAST MEDICAL HISTORY: Past Medical History:  Diagnosis Date  . Arthritis    "right index" (03/28/2016)  . B12 deficiency   . Basal cell carcinoma of right side of nose   . Basal cell carcinoma of skin of  nose    top of bridge  . Chronic back pain    "lower back and cervical spine" (03/28/2016)  . Depression    "stress related" (03/18/2016)  . GERD (gastroesophageal reflux disease)   . History of kidney stones   . Iron deficiency anemia   . Normocytic anemia   . OSA (obstructive sleep apnea)    no cpap after weight loss (03/28/2016)  . Seasonal allergies   . Ventral hernia     PAST SURGICAL HISTORY: Past Surgical History:  Procedure Laterality Date  . AIR/FLUID EXCHANGE Left 03/28/2016   Procedure: AIR/FLUID EXCHANGE;  Surgeon: Hayden Pedro, MD;  Location: [place];  Service: Ophthalmology;  Laterality: Left;  . APPENDECTOMY  2001  . BASAL CELL CARCINOMA EXCISION     "bridge of nose and right side of nose at crease"  . CATARACT EXTRACTION W/ INTRAOCULAR LENS  IMPLANT, BILATERAL Bilateral 2013-2015   left-right  . CHOLECYSTECTOMY OPEN  2001  . EXTRACORPOREAL SHOCK WAVE LITHOTRIPSY  "4-5 times"  . EYE SURGERY    . PARS PLANA VITRECTOMY Left 03/29/2016   WITH 25G REMOVAL/SUTURE INTRAOCULAR LENS   . PARS PLANA VITRECTOMY Left 03/28/2016   Procedure: PARS PLANA VITRECTOMY WITH 25G REMOVAL/SUTURE INTRAOCULAR LENS;  Surgeon: Hayden Pedro, MD;  Location: [place];  Service: Ophthalmology;  Laterality: Left;  . ROUX-EN-Y GASTRIC BYPASS  2001  . SEPTOPLASTY  1986  . TONSILLECTOMY AND ADENOIDECTOMY  1965    FAMILY HISTORY: Family History  Problem Relation Age of Onset  . Diabetes Other   . Heart disease Other   . Anemia  Other   . Brain cancer Other     ADVANCED DIRECTIVES (Y/N):  N  HEALTH MAINTENANCE: Social History   Tobacco Use  . Smoking status: Never Smoker  . Smokeless tobacco: Never Used  Vaping Use  . Vaping Use: Never used  Substance Use Topics  . Alcohol use: Yes    Comment: 03/28/2016 "might have a drink a couple times/month"  . Drug use: No     Colonoscopy:  PAP:  Bone density:  Lipid panel:  Allergies  Allergen Reactions  . Penicillins Hives     Current Outpatient Medications  Medication Sig Dispense Refill  . Ascorbic Acid (VITAMIN C) 1000 MG tablet Take 1,000 mg by mouth daily.    . brimonidine (ALPHAGAN) 0.15 % ophthalmic solution Place 1 drop into the left eye 2 (two) times daily.    . Cholecalciferol (VITAMIN D) 50 MCG (2000 UT) tablet Take 2,000 Units by mouth daily.    . Cyanocobalamin (B-12) 5000 MCG CAPS Take 1 capsule by mouth daily.    . cyclobenzaprine (FLEXERIL) 10 MG tablet   3  . Diclofenac-miSOPROStol 50-0.2 MG TBEC Take 1 tablet by mouth 2 (two) times daily as needed.    . dicyclomine (BENTYL) 20 MG tablet Take 20 mg by mouth every 6 (six) hours.    . famotidine (PEPCID) 40 MG tablet Take 40 mg by mouth daily.     . ferrous sulfate 325 (65 FE) MG EC tablet Take 325 mg by mouth daily.     . Multiple Vitamin (MULTI-VITAMINS) TABS Take 1 tablet by mouth daily.     . ondansetron (ZOFRAN) 8 MG tablet Take 1 tablet (8 mg total) by mouth every 8 (eight) hours as needed for nausea or vomiting. 30 tablet 1  . prochlorperazine (COMPAZINE) 10 MG tablet Take 1 tablet (10 mg total) by mouth every 6 (six) hours as needed (Nausea or vomiting). 60 tablet 2  . rosuvastatin (CRESTOR) 5 MG tablet Take 5 mg by mouth daily.    Marland Kitchen venlafaxine XR (EFFEXOR-XR) 37.5 MG 24 hr capsule Take 37.5 mg by mouth daily.     No current facility-administered medications for this visit.    OBJECTIVE: Vitals:   04/14/20 0856  BP: 117/76  Pulse: 63  Resp: 18  Temp: 98 F (36.7 C)  SpO2: 100%     Body mass index is 31.79 kg/m.    ECOG FS:0 - Asymptomatic  General: Well-developed, well-nourished, no acute distress. Eyes: Pink conjunctiva, anicteric sclera. HEENT: Normocephalic, moist mucous membranes.  Left parotid mass nearly resolved. Lungs: No audible wheezing or coughing. Heart: Regular rate and rhythm. Abdomen: Soft, nontender, no obvious distention. Musculoskeletal: No edema, cyanosis, or clubbing. Neuro: Alert, answering all  questions appropriately. Cranial nerves grossly intact. Skin: No rashes or petechiae noted. Psych: Normal affect.    LAB RESULTS:  Lab Results  Component Value Date   NA 139 04/14/2020   K 4.1 04/14/2020   CL 104 04/14/2020   CO2 25 04/14/2020   GLUCOSE 85 04/14/2020   BUN 13 04/14/2020   CREATININE 0.85 04/14/2020   CALCIUM 8.6 (L) 04/14/2020   PROT 6.4 03/29/2018   ALBUMIN 4.4 03/29/2018   AST 26 03/29/2018   ALT 25 03/29/2018   ALKPHOS 50 03/29/2018   BILITOT 0.5 03/29/2018   GFRNONAA >60 04/14/2020   GFRAA >60 07/26/2015    Lab Results  Component Value Date   WBC 4.4 04/14/2020   NEUTROABS 2.8 04/14/2020   HGB 11.8 (L) 04/14/2020  HCT 35.3 (L) 04/14/2020   MCV 97.5 04/14/2020   PLT 151 04/14/2020     STUDIES: No results found.  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  PLAN:    1. 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 PET scan results from March 04, 2020 reviewed independently with no hypermetabolic activity outside the parotid gland. Given the location of the facial nerve, would like to avoid surgery if possible. Plan to give weekly cisplatin 40 mg/m2 along with daily XRT for 5-8 cycles. Patient has declined port placement at this time. Continue daily XRT.  Proceed with cycle 4 today.  Return to clinic in 1 week for further evaluation and consideration of cycle 5. 2.  Nausea: Patient does not complain of this today.  Continue current antiemetics as prescribed. 3.  Facial pain/numbness: Likely secondary to irritation of facial nerve from XRT.  Monitor. 4.  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  Possibly secondary to cisplatin.  Monitor.   Patient expressed understanding and was in agreement with this plan. He also understands that He can call clinic at any time with any questions, concerns, or complaints.   Cancer Staging Squamous cell  carcinoma of parotid [facility]) Staging form: Major Salivary Glands, AJCC 8th Edition - Clinical stage from 03/05/2020: Stage II (cT2, cN0, cM0) - Signed by Lloyd Huger, MD on 03/05/2020   Lloyd Huger, MD   04/15/2020 6:12 AM

## 2020-04-09 ENCOUNTER — Encounter: Payer: Self-pay | Admitting: Oncology

## 2020-04-12 ENCOUNTER — Ambulatory Visit
Admission: RE | Admit: 2020-04-12 | Discharge: 2020-04-12 | Disposition: A | Discharge status: Home / Self Care | Payer: Managed Care, Other (non HMO) | Source: Ambulatory Visit | Attending: Radiation Oncology | Admitting: Radiation Oncology

## 2020-04-12 DIAGNOSIS — Z510 Encounter for antineoplastic radiation therapy: Secondary | ICD-10-CM | POA: Diagnosis not present

## 2020-04-13 ENCOUNTER — Ambulatory Visit
Admission: RE | Admit: 2020-04-13 | Discharge: 2020-04-13 | Disposition: A | Discharge status: Home / Self Care | Payer: Managed Care, Other (non HMO) | Source: Ambulatory Visit | Attending: Radiation Oncology | Admitting: Radiation Oncology

## 2020-04-13 DIAGNOSIS — Z510 Encounter for antineoplastic radiation therapy: Secondary | ICD-10-CM | POA: Diagnosis not present

## 2020-04-14 ENCOUNTER — Encounter: Payer: Self-pay | Admitting: Oncology

## 2020-04-14 ENCOUNTER — Inpatient Hospital Stay (HOSPITAL_BASED_OUTPATIENT_CLINIC_OR_DEPARTMENT_OTHER): Payer: Managed Care, Other (non HMO) | Admitting: Oncology

## 2020-04-14 ENCOUNTER — Inpatient Hospital Stay: Payer: Managed Care, Other (non HMO)

## 2020-04-14 ENCOUNTER — Ambulatory Visit
Admission: RE | Admit: 2020-04-14 | Discharge: 2020-04-14 | Disposition: A | Discharge status: Home / Self Care | Payer: Managed Care, Other (non HMO) | Source: Ambulatory Visit | Attending: Radiation Oncology | Admitting: Radiation Oncology

## 2020-04-14 VITALS — BP 117/76 | HR 63 | Temp 98.0°F | Resp 18 | Wt 254.4 lb

## 2020-04-14 DIAGNOSIS — H9319 Tinnitus, unspecified ear: Secondary | ICD-10-CM | POA: Insufficient documentation

## 2020-04-14 DIAGNOSIS — D6959 Other secondary thrombocytopenia: Secondary | ICD-10-CM | POA: Insufficient documentation

## 2020-04-14 DIAGNOSIS — C07 Malignant neoplasm of parotid gland: Secondary | ICD-10-CM | POA: Insufficient documentation

## 2020-04-14 DIAGNOSIS — T451X5A Adverse effect of antineoplastic and immunosuppressive drugs, initial encounter: Secondary | ICD-10-CM | POA: Insufficient documentation

## 2020-04-14 DIAGNOSIS — R110 Nausea: Secondary | ICD-10-CM | POA: Diagnosis not present

## 2020-04-14 DIAGNOSIS — Z79899 Other long term (current) drug therapy: Secondary | ICD-10-CM | POA: Insufficient documentation

## 2020-04-14 DIAGNOSIS — R197 Diarrhea, unspecified: Secondary | ICD-10-CM | POA: Insufficient documentation

## 2020-04-14 DIAGNOSIS — R519 Headache, unspecified: Secondary | ICD-10-CM | POA: Insufficient documentation

## 2020-04-14 DIAGNOSIS — Z5111 Encounter for antineoplastic chemotherapy: Secondary | ICD-10-CM | POA: Insufficient documentation

## 2020-04-14 DIAGNOSIS — J329 Chronic sinusitis, unspecified: Secondary | ICD-10-CM | POA: Insufficient documentation

## 2020-04-14 DIAGNOSIS — Z510 Encounter for antineoplastic radiation therapy: Secondary | ICD-10-CM | POA: Diagnosis present

## 2020-04-14 DIAGNOSIS — R200 Anesthesia of skin: Secondary | ICD-10-CM | POA: Insufficient documentation

## 2020-04-14 LAB — CBC WITH DIFFERENTIAL/PLATELET
Abs Immature Granulocytes: 0 10*3/uL (ref 0.00–0.07)
Basophils Absolute: 0.1 10*3/uL (ref 0.0–0.1)
Basophils Relative: 1 %
Eosinophils Absolute: 0.1 10*3/uL (ref 0.0–0.5)
Eosinophils Relative: 1 %
HCT: 35.3 % — ABNORMAL LOW (ref 39.0–52.0)
Hemoglobin: 11.8 g/dL — ABNORMAL LOW (ref 13.0–17.0)
Immature Granulocytes: 0 %
Lymphocytes Relative: 23 %
Lymphs Abs: 1 10*3/uL (ref 0.7–4.0)
MCH: 32.6 pg (ref 26.0–34.0)
MCHC: 33.4 g/dL (ref 30.0–36.0)
MCV: 97.5 fL (ref 80.0–100.0)
Monocytes Absolute: 0.5 10*3/uL (ref 0.1–1.0)
Monocytes Relative: 11 %
Neutro Abs: 2.8 10*3/uL (ref 1.7–7.7)
Neutrophils Relative %: 64 %
Platelets: 151 10*3/uL (ref 150–400)
RBC: 3.62 MIL/uL — ABNORMAL LOW (ref 4.22–5.81)
RDW: 13 % (ref 11.5–15.5)
WBC: 4.4 10*3/uL (ref 4.0–10.5)
nRBC: 0 % (ref 0.0–0.2)

## 2020-04-14 LAB — BASIC METABOLIC PANEL
Anion gap: 10 (ref 5–15)
BUN: 13 mg/dL (ref 8–23)
CO2: 25 mmol/L (ref 22–32)
Calcium: 8.6 mg/dL — ABNORMAL LOW (ref 8.9–10.3)
Chloride: 104 mmol/L (ref 98–111)
Creatinine, Ser: 0.85 mg/dL (ref 0.61–1.24)
GFR, Estimated: >60 mL/min (ref >60)
Glucose, Bld: 85 mg/dL (ref 70–99)
Potassium: 4.1 mmol/L (ref 3.5–5.1)
Sodium: 139 mmol/L (ref 135–145)

## 2020-04-14 LAB — MAGNESIUM: Magnesium: 2 mg/dL (ref 1.7–2.4)

## 2020-04-14 MED ORDER — SODIUM CHLORIDE 0.9 % IV SOLN
150.0000 mg | Freq: Once | INTRAVENOUS | Status: AC
  Administered 2020-04-14: 150 mg via INTRAVENOUS
  Filled 2020-04-14: qty 150

## 2020-04-14 MED ORDER — SODIUM CHLORIDE 0.9 % IV SOLN
100.0000 mg | Freq: Once | INTRAVENOUS | Status: AC
  Administered 2020-04-14: 100 mg via INTRAVENOUS
  Filled 2020-04-14: qty 100

## 2020-04-14 MED ORDER — SODIUM CHLORIDE 0.9 % IV SOLN
Freq: Once | INTRAVENOUS | Status: AC
  Filled 2020-04-14: qty 250

## 2020-04-14 MED ORDER — PALONOSETRON HCL INJECTION 0.25 MG/5ML
0.2500 mg | Freq: Once | INTRAVENOUS | Status: AC
  Administered 2020-04-14: 0.25 mg via INTRAVENOUS
  Filled 2020-04-14: qty 5

## 2020-04-14 MED ORDER — SODIUM CHLORIDE 0.9 % IV SOLN
10.0000 mg | Freq: Once | INTRAVENOUS | Status: AC
  Administered 2020-04-14: 10 mg via INTRAVENOUS
  Filled 2020-04-14: qty 10

## 2020-04-14 MED ORDER — SODIUM CHLORIDE 0.9 % IV SOLN
Freq: Once | INTRAVENOUS | Status: AC
  Filled 2020-04-14: qty 10

## 2020-04-15 ENCOUNTER — Ambulatory Visit
Admission: RE | Admit: 2020-04-15 | Discharge: 2020-04-15 | Disposition: A | Discharge status: Home / Self Care | Payer: Managed Care, Other (non HMO) | Source: Ambulatory Visit | Attending: Radiation Oncology | Admitting: Radiation Oncology

## 2020-04-15 DIAGNOSIS — Z510 Encounter for antineoplastic radiation therapy: Secondary | ICD-10-CM | POA: Diagnosis not present

## 2020-04-16 ENCOUNTER — Ambulatory Visit
Admission: RE | Admit: 2020-04-16 | Discharge: 2020-04-16 | Disposition: A | Discharge status: Home / Self Care | Payer: Managed Care, Other (non HMO) | Source: Ambulatory Visit | Attending: Radiation Oncology | Admitting: Radiation Oncology

## 2020-04-16 DIAGNOSIS — Z510 Encounter for antineoplastic radiation therapy: Secondary | ICD-10-CM | POA: Diagnosis not present

## 2020-04-17 ENCOUNTER — Ambulatory Visit: Payer: Managed Care, Other (non HMO)

## 2020-04-19 ENCOUNTER — Ambulatory Visit
Admission: RE | Admit: 2020-04-19 | Discharge: 2020-04-19 | Disposition: A | Discharge status: Home / Self Care | Payer: Managed Care, Other (non HMO) | Source: Ambulatory Visit | Attending: Radiation Oncology | Admitting: Radiation Oncology

## 2020-04-19 DIAGNOSIS — Z510 Encounter for antineoplastic radiation therapy: Secondary | ICD-10-CM | POA: Diagnosis not present

## 2020-04-20 ENCOUNTER — Ambulatory Visit
Admission: RE | Admit: 2020-04-20 | Discharge: 2020-04-20 | Disposition: A | Discharge status: Home / Self Care | Payer: Managed Care, Other (non HMO) | Source: Ambulatory Visit | Attending: Radiation Oncology | Admitting: Radiation Oncology

## 2020-04-20 DIAGNOSIS — Z510 Encounter for antineoplastic radiation therapy: Secondary | ICD-10-CM | POA: Diagnosis not present

## 2020-04-21 ENCOUNTER — Encounter: Payer: Self-pay | Admitting: Oncology

## 2020-04-21 ENCOUNTER — Inpatient Hospital Stay: Payer: Managed Care, Other (non HMO)

## 2020-04-21 ENCOUNTER — Inpatient Hospital Stay (HOSPITAL_BASED_OUTPATIENT_CLINIC_OR_DEPARTMENT_OTHER): Payer: Managed Care, Other (non HMO) | Admitting: Oncology

## 2020-04-21 ENCOUNTER — Other Ambulatory Visit: Payer: Self-pay

## 2020-04-21 ENCOUNTER — Ambulatory Visit
Admission: RE | Admit: 2020-04-21 | Discharge: 2020-04-21 | Disposition: A | Discharge status: Home / Self Care | Payer: Managed Care, Other (non HMO) | Source: Ambulatory Visit | Attending: Radiation Oncology | Admitting: Radiation Oncology

## 2020-04-21 VITALS — BP 119/78 | HR 62 | Temp 96.5°F | Resp 16 | Wt 255.4 lb

## 2020-04-21 DIAGNOSIS — Z510 Encounter for antineoplastic radiation therapy: Secondary | ICD-10-CM | POA: Diagnosis not present

## 2020-04-21 DIAGNOSIS — C07 Malignant neoplasm of parotid gland: Secondary | ICD-10-CM

## 2020-04-21 LAB — CBC WITH DIFFERENTIAL/PLATELET
Abs Immature Granulocytes: 0.02 10*3/uL (ref 0.00–0.07)
Basophils Absolute: 0 10*3/uL (ref 0.0–0.1)
Basophils Relative: 1 %
Eosinophils Absolute: 0.1 10*3/uL (ref 0.0–0.5)
Eosinophils Relative: 2 %
HCT: 32.9 % — ABNORMAL LOW (ref 39.0–52.0)
Hemoglobin: 11.3 g/dL — ABNORMAL LOW (ref 13.0–17.0)
Immature Granulocytes: 1 %
Lymphocytes Relative: 22 %
Lymphs Abs: 0.9 10*3/uL (ref 0.7–4.0)
MCH: 32.9 pg (ref 26.0–34.0)
MCHC: 34.3 g/dL (ref 30.0–36.0)
MCV: 95.9 fL (ref 80.0–100.0)
Monocytes Absolute: 0.4 10*3/uL (ref 0.1–1.0)
Monocytes Relative: 10 %
Neutro Abs: 2.7 10*3/uL (ref 1.7–7.7)
Neutrophils Relative %: 64 %
Platelets: 101 10*3/uL — ABNORMAL LOW (ref 150–400)
RBC: 3.43 MIL/uL — ABNORMAL LOW (ref 4.22–5.81)
RDW: 13.2 % (ref 11.5–15.5)
WBC: 4 10*3/uL (ref 4.0–10.5)
nRBC: 0 % (ref 0.0–0.2)

## 2020-04-21 LAB — BASIC METABOLIC PANEL
Anion gap: 9 (ref 5–15)
BUN: 17 mg/dL (ref 8–23)
CO2: 25 mmol/L (ref 22–32)
Calcium: 8.2 mg/dL — ABNORMAL LOW (ref 8.9–10.3)
Chloride: 101 mmol/L (ref 98–111)
Creatinine, Ser: 0.97 mg/dL (ref 0.61–1.24)
GFR, Estimated: >60 mL/min (ref >60)
Glucose, Bld: 64 mg/dL — ABNORMAL LOW (ref 70–99)
Potassium: 4.5 mmol/L (ref 3.5–5.1)
Sodium: 135 mmol/L (ref 135–145)

## 2020-04-21 LAB — MAGNESIUM: Magnesium: 2 mg/dL (ref 1.7–2.4)

## 2020-04-21 MED ORDER — SUCRALFATE 1 GM/10ML PO SUSP: 1.0000 g | Freq: Three times a day (TID) | ORAL | 0 refills | Status: DC

## 2020-04-21 MED ORDER — SODIUM CHLORIDE 0.9 % IV SOLN
10.0000 mg | Freq: Once | INTRAVENOUS | Status: AC
  Administered 2020-04-21: 10 mg via INTRAVENOUS
  Filled 2020-04-21: qty 10

## 2020-04-21 MED ORDER — SODIUM CHLORIDE 0.9% FLUSH
10.0000 mL | INTRAVENOUS | Status: DC | PRN
  Filled 2020-04-21: qty 10

## 2020-04-21 MED ORDER — TRAZODONE HCL 50 MG PO TABS
50.0000 mg | ORAL_TABLET | Freq: Every day | ORAL | 0 refills | Status: DC
Start: 2020-04-21 — End: 2020-06-09

## 2020-04-21 MED ORDER — SODIUM CHLORIDE 0.9 % IV SOLN
150.0000 mg | Freq: Once | INTRAVENOUS | Status: AC
  Administered 2020-04-21: 150 mg via INTRAVENOUS
  Filled 2020-04-21: qty 150

## 2020-04-21 MED ORDER — SODIUM CHLORIDE 0.9 % IV SOLN
Freq: Once | INTRAVENOUS | Status: AC
  Filled 2020-04-21: qty 250

## 2020-04-21 MED ORDER — SODIUM CHLORIDE 0.9 % IV SOLN
Freq: Once | INTRAVENOUS | Status: AC
  Filled 2020-04-21: qty 10

## 2020-04-21 MED ORDER — SODIUM CHLORIDE 0.9 % IV SOLN
100.0000 mg | Freq: Once | INTRAVENOUS | Status: AC
  Administered 2020-04-21: 100 mg via INTRAVENOUS
  Filled 2020-04-21: qty 100

## 2020-04-21 MED ORDER — PALONOSETRON HCL INJECTION 0.25 MG/5ML
0.2500 mg | Freq: Once | INTRAVENOUS | Status: AC
  Administered 2020-04-21: 0.25 mg via INTRAVENOUS
  Filled 2020-04-21: qty 5

## 2020-04-21 NOTE — Progress Notes (Signed)
Cisplatin well tolerated. Discharged home in stable condition.

## 2020-04-21 NOTE — Progress Notes (Signed)
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  INTERVAL HISTORY: Patient returns to clinic today for further evaluation and consideration of cycle 5 of weekly cisplatin.  He is feeling okay.  He has increased trouble swallowing secondary to lack of saliva production and a sore throat from radiation.  He has tried every Biotene product he can find without any relief.  He has been having trouble sleeping and is interested in something that may help him.  His appetite has declined and everything tastes like "sawdust".  He has history of gastric bypass and unfortunately is unable to eat and drink at the same time so is having trouble eating solid or dry foods.  He feels up for treatment today. He has no neurologic complaints. He denies any recent fevers or illnesses. He has a good appetite and denies weight loss.  Has sore throat and mild dysphagia.  He has no chest pain, shortness of breath, cough, or hemoptysis. He denies any nausea, vomiting, constipation, or diarrhea. He has no urinary complaints.  Patient offers no further specific complaints today.  REVIEW OF SYSTEMS:   Review of Systems  Constitutional: Positive for malaise/fatigue. Negative for chills, fever and weight loss.       Change in appetite  HENT: Positive for sore throat. Negative for congestion, ear pain and tinnitus.        Trouble swallowing solid foods  Eyes: Negative.  Negative for blurred vision and double vision.  Respiratory: Negative.  Negative for cough, sputum production and shortness of breath.   Cardiovascular: Negative.  Negative for chest pain, palpitations and leg swelling.  Gastrointestinal: Negative.  Negative for abdominal  pain, constipation, diarrhea, nausea and vomiting.  Genitourinary: Negative for dysuria, frequency and urgency.  Musculoskeletal: Negative for back pain and falls.  Skin: Negative.  Negative for rash.  Neurological: Negative.  Negative for weakness and headaches.  Endo/Heme/Allergies: Negative.  Does not bruise/bleed easily.  Psychiatric/Behavioral: Negative for depression. The patient has insomnia. The patient is not nervous/anxious.     As per HPI. Otherwise, a complete review of systems is negative.  PAST MEDICAL HISTORY: Past Medical History:  Diagnosis Date  . Arthritis    "right index" (03/28/2016)  . B12 deficiency   . Basal cell carcinoma of right side of nose   . Basal cell carcinoma of skin of nose    top of bridge  . Chronic back pain    "lower back and cervical spine" (03/28/2016)  . Depression    "stress related" (03/18/2016)  . GERD (gastroesophageal reflux disease)   . History of kidney stones   . Iron deficiency anemia   . Normocytic anemia   . OSA (obstructive sleep apnea)    no cpap after weight loss (03/28/2016)  . Seasonal allergies   . Ventral hernia     PAST SURGICAL HISTORY: Past Surgical History:  Procedure Laterality Date  . AIR/FLUID EXCHANGE Left 03/28/2016   Procedure: AIR/FLUID EXCHANGE;  Surgeon: Hayden Pedro, MD;  Location: [place];  Service: Ophthalmology;  Laterality: Left;  . APPENDECTOMY  2001  . BASAL CELL CARCINOMA EXCISION     "bridge of nose and right side of nose at crease"  . CATARACT EXTRACTION W/ INTRAOCULAR LENS  IMPLANT, BILATERAL Bilateral 2013-2015   left-right  . CHOLECYSTECTOMY OPEN  2001  . EXTRACORPOREAL SHOCK WAVE LITHOTRIPSY  "4-5 times"  . EYE SURGERY    . PARS PLANA VITRECTOMY Left 03/29/2016   WITH 25G REMOVAL/SUTURE INTRAOCULAR LENS   . PARS PLANA VITRECTOMY Left 03/28/2016   Procedure: PARS PLANA VITRECTOMY WITH 25G REMOVAL/SUTURE INTRAOCULAR LENS;  Surgeon: Hayden Pedro, MD;  Location: [place];  Service:  Ophthalmology;  Laterality: Left;  . ROUX-EN-Y GASTRIC BYPASS  2001  . SEPTOPLASTY  1986  . TONSILLECTOMY AND ADENOIDECTOMY  1965    FAMILY HISTORY: Family History  Problem Relation Age of Onset  . Diabetes Other   . Heart disease Other   . Anemia Other   . Brain cancer Other     ADVANCED DIRECTIVES (Y/N):  N  HEALTH MAINTENANCE: Social History   Tobacco Use  . Smoking status: Never Smoker  . Smokeless tobacco: Never Used  Vaping Use  . Vaping Use: Never used  Substance Use Topics  . Alcohol use: Yes    Comment: 03/28/2016 "might have a drink a couple times/month"  . Drug use: No     Colonoscopy:  PAP:  Bone density:  Lipid panel:  Allergies  Allergen Reactions  . Penicillins Hives    Current Outpatient Medications  Medication Sig Dispense Refill  . Ascorbic Acid (VITAMIN C) 1000 MG tablet Take 1,000 mg by mouth daily.    . brimonidine (ALPHAGAN) 0.15 % ophthalmic solution Place 1 drop into the left eye 2 (two) times daily.    . Cholecalciferol (VITAMIN D) 50 MCG (2000 UT) tablet Take 2,000 Units by mouth daily.    . Cyanocobalamin (B-12) 5000 MCG CAPS Take 1 capsule by mouth daily.    . cyclobenzaprine (FLEXERIL) 10 MG tablet   3  . Diclofenac-miSOPROStol 50-0.2 MG TBEC Take 1 tablet by mouth 2 (two) times daily as needed.    . dicyclomine (BENTYL) 20 MG tablet Take 20 mg by mouth every 6 (six) hours.    . famotidine (PEPCID) 40 MG tablet Take 40 mg by mouth daily.     . ferrous sulfate 325 (65 FE) MG EC tablet Take 325 mg by mouth daily.     . Multiple Vitamin (MULTI-VITAMINS) TABS Take 1 tablet by mouth daily.     . ondansetron (ZOFRAN) 8 MG tablet Take 1 tablet (8 mg total) by mouth every 8 (eight) hours as needed for nausea or vomiting. 30 tablet 1  . prochlorperazine (COMPAZINE) 10 MG tablet Take 1 tablet (10 mg total) by mouth every 6 (six) hours as needed (Nausea or vomiting). 60 tablet 2  . rosuvastatin (CRESTOR) 5 MG tablet Take 5 mg by mouth daily.     Marland Kitchen venlafaxine XR (EFFEXOR-XR) 37.5 MG 24 hr capsule Take 37.5 mg by mouth daily.    . sucralfate (CARAFATE) 1 GM/10ML suspension Take 10 mLs (1 g total) by mouth 4 (four) times daily -  with meals and at bedtime. 420 mL 0  . traZODone (DESYREL) 50 MG tablet Take 1-2 tablets (50-100 mg total) by mouth at bedtime. 90 tablet 0   No current facility-administered medications for this visit.   Facility-Administered Medications Ordered in Other Visits  Medication Dose Route Frequency Provider Last Rate Last Admin  . CISplatin (PLATINOL) 100 mg in sodium chloride 0.9 % 500 mL chemo infusion  100 mg Intravenous Once Lloyd Huger, MD      . dexamethasone (DECADRON) 10 mg in sodium chloride 0.9 %  50 mL IVPB  10 mg Intravenous Once Lloyd Huger, MD      . fosaprepitant (EMEND) 150 mg in sodium chloride 0.9 % 145 mL IVPB  150 mg Intravenous Once Lloyd Huger, MD      . palonosetron (ALOXI) injection 0.25 mg  0.25 mg Intravenous Once Lloyd Huger, MD      . sodium chloride flush (NS) 0.9 % injection 10 mL  10 mL Intracatheter PRN Lloyd Huger, MD        OBJECTIVE: Vitals:   04/21/20 0915  BP: 119/78  Pulse: 62  Resp: 16  Temp: (!) 96.5 F (35.8 C)  SpO2: 98%     Body mass index is 31.93 kg/m.    ECOG FS:0 - Asymptomatic  Physical Exam Constitutional:      Appearance: Normal appearance.  HENT:     Head: Normocephalic and atraumatic.  Eyes:     Pupils: Pupils are equal, round, and reactive to light.  Cardiovascular:     Rate and Rhythm: Normal rate and regular rhythm.     Heart sounds: Normal heart sounds. No murmur heard.   Pulmonary:     Effort: Pulmonary effort is normal.     Breath sounds: Normal breath sounds. No wheezing.  Abdominal:     General: Bowel sounds are normal. There is no distension.     Palpations: Abdomen is soft.     Tenderness: There is no abdominal tenderness.  Musculoskeletal:        General: Normal range of motion.      Cervical back: Normal range of motion.  Skin:    General: Skin is warm and dry.     Findings: No rash.  Neurological:     Mental Status: He is alert and oriented to person, place, and time.  Psychiatric:        Judgment: Judgment normal.       LAB RESULTS:  Lab Results  Component Value Date   NA 135 04/21/2020   K 4.5 04/21/2020   CL 101 04/21/2020   CO2 25 04/21/2020   GLUCOSE 64 (L) 04/21/2020   BUN 17 04/21/2020   CREATININE 0.97 04/21/2020   CALCIUM 8.2 (L) 04/21/2020   PROT 6.4 03/29/2018   ALBUMIN 4.4 03/29/2018   AST 26 03/29/2018   ALT 25 03/29/2018   ALKPHOS 50 03/29/2018   BILITOT 0.5 03/29/2018   GFRNONAA >60 04/21/2020   GFRAA >60 07/26/2015    Lab Results  Component Value Date   WBC 4.0 04/21/2020   NEUTROABS 2.7 04/21/2020   HGB 11.3 (L) 04/21/2020   HCT 32.9 (L) 04/21/2020   MCV 95.9 04/21/2020   PLT 101 (L) 04/21/2020     STUDIES: No results found.  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 PET scan results from March 04, 2020 reviewed independently with no hypermetabolic activity outside the parotid gland. Given the location of the facial nerve, would like to avoid surgery if possible. Plan to give weekly cisplatin 40 mg/m2 along with daily XRT for 5-8 cycles. Patient has declined port placement at this time. 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  PLAN:    1.  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  -Lab work from today is unremarkable.  -Proceed with cycle 5.  -RTC in 1 week for evaluation  and consideration of cycle 6.   2.  Dysphagia: -Secondary to radiation. -He has tried Biotene products without relief.  3.  Sore throat: -Secondary to radiation. -Would recommend healio's amino acid supplement. -Email sent to [facility] [place] to get this ordered and  sent to his home.  4.  Insomnia: -Patient is waking up every few hours due to dry mouth. -Can try trazodone 50 mg at bedtime.  5.  Tinnitus: -He does not complain of that today.  Greater than 50% was spent in counseling and coordination of care with this patient including but not limited to discussion of the relevant topics above (See A&P) including, but not limited to diagnosis and management of acute and chronic medical conditions.   Patient expressed understanding and was in agreement with this plan. He also understands that He can call clinic at any time with any questions, concerns, or complaints.   Cancer Staging Squamous cell carcinoma of parotid [facility]) Staging form: Major Salivary Glands, AJCC 8th Edition - Clinical stage from 03/05/2020: Stage II (cT2, cN0, cM0) - Signed by Lloyd Huger, MD on 03/05/2020   Jacquelin Hawking, NP   04/21/2020 11:08 AM

## 2020-04-22 ENCOUNTER — Ambulatory Visit: Payer: Managed Care, Other (non HMO)

## 2020-04-22 ENCOUNTER — Ambulatory Visit
Admission: RE | Admit: 2020-04-22 | Discharge: 2020-04-22 | Disposition: A | Discharge status: Home / Self Care | Payer: Managed Care, Other (non HMO) | Source: Ambulatory Visit | Attending: Radiation Oncology | Admitting: Radiation Oncology

## 2020-04-22 DIAGNOSIS — Z510 Encounter for antineoplastic radiation therapy: Secondary | ICD-10-CM | POA: Diagnosis not present

## 2020-04-23 ENCOUNTER — Ambulatory Visit: Payer: Managed Care, Other (non HMO)

## 2020-04-24 NOTE — Progress Notes (Signed)
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  INTERVAL HISTORY: Patient returns to clinic today for further evaluation and consideration of cycle 6 of weekly cisplatin.  He has increased weakness and fatigue and a poor appetite.  He had a sinus infection over the weekend and was given Levaquin which caused increased diarrhea.  His congestion is improved and is no longer having fevers.  He has lost weight in the interim.  He continues to have pain on the left side of his face.  He did not mention tinnitus today. He has no neurologic complaints. He denies any dysphagia.  He has no chest pain, shortness of breath, cough, or hemoptysis.  He denies any nausea, vomiting, or constipation.  He has no urinary complaints.  Patient offers no further specific complaints today.  REVIEW OF SYSTEMS:   Review of Systems  Constitutional: Positive for malaise/fatigue and weight loss. Negative for fever.  HENT: Positive for congestion and tinnitus.   Respiratory: Negative.  Negative for cough and shortness of breath.   Cardiovascular: Negative.  Negative for chest pain and leg swelling.  Gastrointestinal: Positive for diarrhea. Negative for abdominal pain and nausea.  Genitourinary: Negative.  Negative for dysuria.  Musculoskeletal: Negative.  Negative for back pain.  Skin: Negative.  Negative for rash.  Neurological: Positive for weakness. Negative for dizziness, focal weakness and headaches.  Psychiatric/Behavioral: Negative.  The patient is not nervous/anxious.     As per HPI. Otherwise, a complete review of systems is negative.  PAST MEDICAL HISTORY: Past Medical History:  Diagnosis Date  . Arthritis     "right index" (03/28/2016)  . B12 deficiency   . Basal cell carcinoma of right side of nose   . Basal cell carcinoma of skin of nose    top of bridge  . Chronic back pain    "lower back and cervical spine" (03/28/2016)  . Depression    "stress related" (03/18/2016)  . GERD (gastroesophageal reflux disease)   . History of kidney stones   . Iron deficiency anemia   . Normocytic anemia   . OSA (obstructive sleep apnea)    no cpap after weight loss (03/28/2016)  . Seasonal allergies   . Ventral hernia     PAST SURGICAL HISTORY: Past Surgical History:  Procedure Laterality Date  . AIR/FLUID EXCHANGE Left 03/28/2016   Procedure: AIR/FLUID EXCHANGE;  Surgeon: Hayden Pedro, MD;  Location: [place];  Service: Ophthalmology;  Laterality: Left;  . APPENDECTOMY  2001  . BASAL CELL CARCINOMA EXCISION     "bridge of nose and right side of nose at crease"  . CATARACT EXTRACTION W/ INTRAOCULAR LENS  IMPLANT, BILATERAL Bilateral 2013-2015   left-right  . CHOLECYSTECTOMY OPEN  2001  . EXTRACORPOREAL SHOCK WAVE LITHOTRIPSY  "4-5 times"  . EYE SURGERY    . PARS PLANA VITRECTOMY Left 03/29/2016   WITH 25G REMOVAL/SUTURE INTRAOCULAR LENS   . PARS PLANA VITRECTOMY Left 03/28/2016   Procedure: PARS PLANA VITRECTOMY WITH 25G REMOVAL/SUTURE INTRAOCULAR LENS;  Surgeon: Hayden Pedro, MD;  Location: [facility];  Service: Ophthalmology;  Laterality: Left;  . ROUX-EN-Y GASTRIC BYPASS  2001  . SEPTOPLASTY  1986  . TONSILLECTOMY AND ADENOIDECTOMY  1965    FAMILY HISTORY:  Family History  Problem Relation Age of Onset  . Diabetes Other   . Heart disease Other   . Anemia Other   . Brain cancer Other     ADVANCED DIRECTIVES (Y/N):  N  HEALTH MAINTENANCE: Social History   Tobacco Use  . Smoking status: Never Smoker  . Smokeless tobacco: Never Used  Vaping Use  . Vaping Use: Never used  Substance Use Topics  . Alcohol use: Yes    Comment: 03/28/2016 "might have a drink a couple times/month"  .  Drug use: No     Colonoscopy:  PAP:  Bone density:  Lipid panel:  Allergies  Allergen Reactions  . Penicillins Hives    Current Outpatient Medications  Medication Sig Dispense Refill  . brimonidine (ALPHAGAN) 0.15 % ophthalmic solution Place 1 drop into the left eye 2 (two) times daily.    . cyclobenzaprine (FLEXERIL) 10 MG tablet   3  . Diclofenac-miSOPROStol 50-0.2 MG TBEC Take 1 tablet by mouth 2 (two) times daily as needed.    . dicyclomine (BENTYL) 20 MG tablet Take 20 mg by mouth every 6 (six) hours.    . famotidine (PEPCID) 40 MG tablet Take 40 mg by mouth daily.     Marland Kitchen levofloxacin (LEVAQUIN) 500 MG tablet Take 1 tablet (500 mg total) by mouth daily. 5 tablet 0  . ondansetron (ZOFRAN) 8 MG tablet Take 1 tablet (8 mg total) by mouth every 8 (eight) hours as needed for nausea or vomiting. 30 tablet 1  . rosuvastatin (CRESTOR) 5 MG tablet Take 5 mg by mouth daily.    . sucralfate (CARAFATE) 1 GM/10ML suspension Take 10 mLs (1 g total) by mouth 4 (four) times daily -  with meals and at bedtime. 420 mL 0  . traZODone (DESYREL) 50 MG tablet Take 1-2 tablets (50-100 mg total) by mouth at bedtime. 90 tablet 0  . venlafaxine XR (EFFEXOR-XR) 37.5 MG 24 hr capsule Take 37.5 mg by mouth daily.    . Ascorbic Acid (VITAMIN C) 1000 MG tablet Take 1,000 mg by mouth daily. (Patient not taking: Reported on 04/28/2020)    . Cholecalciferol (VITAMIN D) 50 MCG (2000 UT) tablet Take 2,000 Units by mouth daily. (Patient not taking: Reported on 04/28/2020)    . Cyanocobalamin (B-12) 5000 MCG CAPS Take 1 capsule by mouth daily. (Patient not taking: Reported on 04/28/2020)    . ferrous sulfate 325 (65 FE) MG EC tablet Take 325 mg by mouth daily.  (Patient not taking: Reported on 04/28/2020)    . Multiple Vitamin (MULTI-VITAMINS) TABS Take 1 tablet by mouth daily.  (Patient not taking: Reported on 04/28/2020)    . prochlorperazine (COMPAZINE) 10 MG tablet TAKE 1 TABLET(10 MG) BY MOUTH EVERY 6 HOURS AS  NEEDED FOR NAUSEA OR VOMITING 60 tablet 2   No current facility-administered medications for this visit.    OBJECTIVE: Vitals:   04/28/20 0839  BP: 110/67  Pulse: 73  Resp: 18  Temp: 97.7 F (36.5 C)  SpO2: 100%     Body mass index is 31.05 kg/m.    ECOG FS:0 - Asymptomatic  General: Well-developed, well-nourished, no acute distress. Eyes: Pink conjunctiva, anicteric sclera. HEENT: Normocephalic, moist mucous membranes. Lungs: No audible wheezing or coughing. Heart: Regular rate and rhythm. Abdomen: Soft, nontender, no obvious distention. Musculoskeletal: No edema, cyanosis, or clubbing. Neuro: Alert, answering all questions appropriately. Cranial nerves grossly intact. Skin: No rashes or petechiae noted. Psych: Normal affect.   LAB RESULTS:  Lab Results  Component Value Date   NA 138 04/28/2020   K 4.4 04/28/2020   CL 104 04/28/2020   CO2 25 04/28/2020   GLUCOSE 47 (L) 04/28/2020   BUN 16 04/28/2020   CREATININE 0.86 04/28/2020   CALCIUM 8.6 (L) 04/28/2020   PROT 6.4 03/29/2018   ALBUMIN 4.4 03/29/2018   AST 26 03/29/2018   ALT 25 03/29/2018   ALKPHOS 50 03/29/2018   BILITOT 0.5 03/29/2018   GFRNONAA >60 04/28/2020   GFRAA >60 07/26/2015    Lab Results  Component Value Date   WBC 2.7 (L) 04/28/2020   NEUTROABS 1.7 04/28/2020   HGB 10.8 (L) 04/28/2020   HCT 31.2 (L) 04/28/2020   MCV 96.0 04/28/2020   PLT 78 (L) 04/28/2020     STUDIES: No results found.  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  PLAN:    1. 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 PET scan results from March 04, 2020 reviewed independently with no hypermetabolic activity outside the parotid gland. Given the location of the facial nerve, would like to avoid surgery if possible. Plan to give weekly cisplatin 40 mg/m2 along with daily XRT for 5-8 cycles. Patient has declined port placement at this time. Continue  daily XRT.  Delay cycle 6 of treatment today secondary to declining performance status and thrombocytopenia.  Continue daily XRT completing treatment on May 20, 2020.  Return to clinic in 1 week for further evaluation and reconsideration of cycle 6.   2.  Nausea: Patient does not complain of this today.  Continue current antiemetics as prescribed. 3.  Facial pain/numbness: Likely secondary to irritation of facial nerve from XRT.  Monitor. 4.  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  Possibly secondary to cisplatin.  Monitor. 5.  Sinus infection: Improved with Levaquin. 6.  Diarrhea: Patient reports this is secondary to Levaquin and has now discontinued antibiotics. 7.  Thrombocytopenia: Delay treatment as above.   Patient expressed understanding and was in agreement with this plan. He also understands that He can call clinic at any time with any questions, concerns, or complaints.   Cancer Staging Squamous cell carcinoma of parotid [facility]) Staging form: Major Salivary Glands, AJCC 8th Edition - Clinical stage from 03/05/2020: Stage II (cT2, cN0, cM0) - Signed by Lloyd Huger, MD on 03/05/2020   Lloyd Huger, MD   04/29/2020 6:32 AM

## 2020-04-25 ENCOUNTER — Telehealth: Payer: Self-pay | Admitting: Internal Medicine

## 2020-04-25 MED ORDER — LEVOFLOXACIN 500 MG PO TABS: 500.0000 mg | ORAL_TABLET | Freq: Every day | ORAL | 0 refills | Status: DC

## 2020-04-25 NOTE — Telephone Encounter (Signed)
Pt called in for "sinus infection"- nasal congestion/sore throat/ lowgrade temp 99. Similar to previous smilimar infections. Does not feel related to cehmo-RT.  Will call in levafloaxcin for 5 day. If issues will contact cancer center staff when he comes for his daily RT.  FYI-Dr.Finnegan.   GB

## 2020-04-26 ENCOUNTER — Telehealth: Payer: Self-pay | Admitting: *Deleted

## 2020-04-26 ENCOUNTER — Ambulatory Visit: Payer: Managed Care, Other (non HMO)

## 2020-04-26 NOTE — Telephone Encounter (Signed)
This was taken care of yesterday

## 2020-04-27 ENCOUNTER — Ambulatory Visit
Admission: RE | Admit: 2020-04-27 | Discharge: 2020-04-27 | Disposition: A | Discharge status: Home / Self Care | Payer: Managed Care, Other (non HMO) | Source: Ambulatory Visit | Attending: Radiation Oncology | Admitting: Radiation Oncology

## 2020-04-27 DIAGNOSIS — Z510 Encounter for antineoplastic radiation therapy: Secondary | ICD-10-CM | POA: Diagnosis not present

## 2020-04-27 NOTE — Telephone Encounter (Signed)
Noted. Manuella Ghazi, NP 04/27/2020 7:27 AM

## 2020-04-28 ENCOUNTER — Other Ambulatory Visit: Payer: Self-pay

## 2020-04-28 ENCOUNTER — Inpatient Hospital Stay: Payer: Managed Care, Other (non HMO)

## 2020-04-28 ENCOUNTER — Encounter: Payer: Self-pay | Admitting: Oncology

## 2020-04-28 ENCOUNTER — Inpatient Hospital Stay (HOSPITAL_BASED_OUTPATIENT_CLINIC_OR_DEPARTMENT_OTHER): Payer: Managed Care, Other (non HMO) | Admitting: Oncology

## 2020-04-28 ENCOUNTER — Ambulatory Visit
Admission: RE | Admit: 2020-04-28 | Discharge: 2020-04-28 | Disposition: A | Discharge status: Home / Self Care | Payer: Managed Care, Other (non HMO) | Source: Ambulatory Visit | Attending: Radiation Oncology | Admitting: Radiation Oncology

## 2020-04-28 VITALS — BP 110/67 | HR 73 | Temp 97.7°F | Resp 18 | Wt 248.4 lb

## 2020-04-28 DIAGNOSIS — C07 Malignant neoplasm of parotid gland: Secondary | ICD-10-CM | POA: Diagnosis not present

## 2020-04-28 DIAGNOSIS — Z510 Encounter for antineoplastic radiation therapy: Secondary | ICD-10-CM | POA: Diagnosis not present

## 2020-04-28 LAB — BASIC METABOLIC PANEL
Anion gap: 9 (ref 5–15)
BUN: 16 mg/dL (ref 8–23)
CO2: 25 mmol/L (ref 22–32)
Calcium: 8.6 mg/dL — ABNORMAL LOW (ref 8.9–10.3)
Chloride: 104 mmol/L (ref 98–111)
Creatinine, Ser: 0.86 mg/dL (ref 0.61–1.24)
GFR, Estimated: >60 mL/min (ref >60)
Glucose, Bld: 47 mg/dL — ABNORMAL LOW (ref 70–99)
Potassium: 4.4 mmol/L (ref 3.5–5.1)
Sodium: 138 mmol/L (ref 135–145)

## 2020-04-28 LAB — CBC WITH DIFFERENTIAL/PLATELET
Abs Immature Granulocytes: 0.01 10*3/uL (ref 0.00–0.07)
Basophils Absolute: 0 10*3/uL (ref 0.0–0.1)
Basophils Relative: 1 %
Eosinophils Absolute: 0 10*3/uL (ref 0.0–0.5)
Eosinophils Relative: 2 %
HCT: 31.2 % — ABNORMAL LOW (ref 39.0–52.0)
Hemoglobin: 10.8 g/dL — ABNORMAL LOW (ref 13.0–17.0)
Immature Granulocytes: 0 %
Lymphocytes Relative: 22 %
Lymphs Abs: 0.6 10*3/uL — ABNORMAL LOW (ref 0.7–4.0)
MCH: 33.2 pg (ref 26.0–34.0)
MCHC: 34.6 g/dL (ref 30.0–36.0)
MCV: 96 fL (ref 80.0–100.0)
Monocytes Absolute: 0.3 10*3/uL (ref 0.1–1.0)
Monocytes Relative: 11 %
Neutro Abs: 1.7 10*3/uL (ref 1.7–7.7)
Neutrophils Relative %: 64 %
Platelets: 78 10*3/uL — ABNORMAL LOW (ref 150–400)
RBC: 3.25 MIL/uL — ABNORMAL LOW (ref 4.22–5.81)
RDW: 13.9 % (ref 11.5–15.5)
WBC: 2.7 10*3/uL — ABNORMAL LOW (ref 4.0–10.5)
nRBC: 0 % (ref 0.0–0.2)

## 2020-04-28 LAB — MAGNESIUM: Magnesium: 1.9 mg/dL (ref 1.7–2.4)

## 2020-04-29 ENCOUNTER — Ambulatory Visit
Admission: RE | Admit: 2020-04-29 | Discharge: 2020-04-29 | Disposition: A | Discharge status: Home / Self Care | Payer: Managed Care, Other (non HMO) | Source: Ambulatory Visit | Attending: Radiation Oncology | Admitting: Radiation Oncology

## 2020-04-29 ENCOUNTER — Other Ambulatory Visit: Payer: Self-pay | Admitting: Oncology

## 2020-04-29 DIAGNOSIS — Z510 Encounter for antineoplastic radiation therapy: Secondary | ICD-10-CM | POA: Diagnosis not present

## 2020-04-29 DIAGNOSIS — C07 Malignant neoplasm of parotid gland: Secondary | ICD-10-CM

## 2020-04-30 ENCOUNTER — Encounter: Payer: Self-pay | Admitting: Oncology

## 2020-04-30 ENCOUNTER — Ambulatory Visit
Admission: RE | Admit: 2020-04-30 | Discharge: 2020-04-30 | Disposition: A | Discharge status: Home / Self Care | Payer: Managed Care, Other (non HMO) | Source: Ambulatory Visit | Attending: Radiation Oncology | Admitting: Radiation Oncology

## 2020-04-30 DIAGNOSIS — Z510 Encounter for antineoplastic radiation therapy: Secondary | ICD-10-CM | POA: Diagnosis not present

## 2020-05-03 ENCOUNTER — Ambulatory Visit
Admission: RE | Admit: 2020-05-03 | Discharge: 2020-05-03 | Disposition: A | Discharge status: Home / Self Care | Payer: Managed Care, Other (non HMO) | Source: Ambulatory Visit | Attending: Radiation Oncology | Admitting: Radiation Oncology

## 2020-05-03 DIAGNOSIS — Z510 Encounter for antineoplastic radiation therapy: Secondary | ICD-10-CM | POA: Diagnosis not present

## 2020-05-04 ENCOUNTER — Ambulatory Visit
Admission: RE | Admit: 2020-05-04 | Discharge: 2020-05-04 | Disposition: A | Discharge status: Home / Self Care | Payer: Managed Care, Other (non HMO) | Source: Ambulatory Visit | Attending: Radiation Oncology | Admitting: Radiation Oncology

## 2020-05-04 DIAGNOSIS — Z510 Encounter for antineoplastic radiation therapy: Secondary | ICD-10-CM | POA: Diagnosis not present

## 2020-05-05 ENCOUNTER — Inpatient Hospital Stay (HOSPITAL_BASED_OUTPATIENT_CLINIC_OR_DEPARTMENT_OTHER): Payer: Managed Care, Other (non HMO) | Admitting: Oncology

## 2020-05-05 ENCOUNTER — Ambulatory Visit
Admission: RE | Admit: 2020-05-05 | Discharge: 2020-05-05 | Disposition: A | Discharge status: Home / Self Care | Payer: Managed Care, Other (non HMO) | Source: Ambulatory Visit | Attending: Radiation Oncology | Admitting: Radiation Oncology

## 2020-05-05 ENCOUNTER — Encounter: Payer: Self-pay | Admitting: Oncology

## 2020-05-05 ENCOUNTER — Inpatient Hospital Stay: Payer: Managed Care, Other (non HMO)

## 2020-05-05 VITALS — BP 96/67 | HR 67 | Temp 97.3°F | Resp 16 | Ht 75.0 in | Wt 249.6 lb

## 2020-05-05 DIAGNOSIS — C07 Malignant neoplasm of parotid gland: Secondary | ICD-10-CM

## 2020-05-05 DIAGNOSIS — Z510 Encounter for antineoplastic radiation therapy: Secondary | ICD-10-CM | POA: Diagnosis not present

## 2020-05-05 LAB — CBC WITH DIFFERENTIAL/PLATELET
Abs Immature Granulocytes: 0.01 10*3/uL (ref 0.00–0.07)
Basophils Absolute: 0 10*3/uL (ref 0.0–0.1)
Basophils Relative: 1 %
Eosinophils Absolute: 0.1 10*3/uL (ref 0.0–0.5)
Eosinophils Relative: 2 %
HCT: 29.8 % — ABNORMAL LOW (ref 39.0–52.0)
Hemoglobin: 10.4 g/dL — ABNORMAL LOW (ref 13.0–17.0)
Immature Granulocytes: 1 %
Lymphocytes Relative: 29 %
Lymphs Abs: 0.6 10*3/uL — ABNORMAL LOW (ref 0.7–4.0)
MCH: 33.8 pg (ref 26.0–34.0)
MCHC: 34.9 g/dL (ref 30.0–36.0)
MCV: 96.8 fL (ref 80.0–100.0)
Monocytes Absolute: 0.2 10*3/uL (ref 0.1–1.0)
Monocytes Relative: 10 %
Neutro Abs: 1.3 10*3/uL — ABNORMAL LOW (ref 1.7–7.7)
Neutrophils Relative %: 57 %
Platelets: 115 10*3/uL — ABNORMAL LOW (ref 150–400)
RBC: 3.08 MIL/uL — ABNORMAL LOW (ref 4.22–5.81)
RDW: 15.3 % (ref 11.5–15.5)
WBC: 2.2 10*3/uL — ABNORMAL LOW (ref 4.0–10.5)
nRBC: 0 % (ref 0.0–0.2)

## 2020-05-05 LAB — BASIC METABOLIC PANEL
Anion gap: 6 (ref 5–15)
BUN: 12 mg/dL (ref 8–23)
CO2: 28 mmol/L (ref 22–32)
Calcium: 8.6 mg/dL — ABNORMAL LOW (ref 8.9–10.3)
Chloride: 103 mmol/L (ref 98–111)
Creatinine, Ser: 1.05 mg/dL (ref 0.61–1.24)
GFR, Estimated: >60 mL/min (ref >60)
Glucose, Bld: 93 mg/dL (ref 70–99)
Potassium: 4.2 mmol/L (ref 3.5–5.1)
Sodium: 137 mmol/L (ref 135–145)

## 2020-05-05 LAB — MAGNESIUM: Magnesium: 1.8 mg/dL (ref 1.7–2.4)

## 2020-05-05 MED ORDER — SODIUM CHLORIDE 0.9 % IV SOLN
Freq: Once | INTRAVENOUS | Status: AC
  Filled 2020-05-05: qty 250

## 2020-05-05 MED ORDER — PALONOSETRON HCL INJECTION 0.25 MG/5ML
0.2500 mg | Freq: Once | INTRAVENOUS | Status: AC
  Administered 2020-05-05: 0.25 mg via INTRAVENOUS
  Filled 2020-05-05: qty 5

## 2020-05-05 MED ORDER — SODIUM CHLORIDE 0.9 % IV SOLN
41.0000 mg/m2 | Freq: Once | INTRAVENOUS | Status: AC
  Administered 2020-05-05: 100 mg via INTRAVENOUS
  Filled 2020-05-05: qty 100

## 2020-05-05 MED ORDER — SODIUM CHLORIDE 0.9 % IV SOLN
Freq: Once | INTRAVENOUS | Status: AC
  Filled 2020-05-05: qty 1000

## 2020-05-05 MED ORDER — SODIUM CHLORIDE 0.9 % IV SOLN
150.0000 mg | Freq: Once | INTRAVENOUS | Status: AC
  Administered 2020-05-05: 150 mg via INTRAVENOUS
  Filled 2020-05-05: qty 150

## 2020-05-05 MED ORDER — SODIUM CHLORIDE 0.9 % IV SOLN
40.0000 mg/m2 | Freq: Once | INTRAVENOUS | Status: DC
  Filled 2020-05-05: qty 98

## 2020-05-05 MED ORDER — SODIUM CHLORIDE 0.9 % IV SOLN
10.0000 mg | Freq: Once | INTRAVENOUS | Status: AC
  Administered 2020-05-05: 10 mg via INTRAVENOUS
  Filled 2020-05-05: qty 10

## 2020-05-05 NOTE — Progress Notes (Signed)
Pt stable at discharge.  

## 2020-05-05 NOTE — Progress Notes (Signed)
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  INTERVAL HISTORY: Patient returns to clinic today for further evaluation and consideration of cycle 5 of weekly cisplatin.  He is feeling okay.  He has increased trouble swallowing secondary to lack of saliva production and a sore throat from radiation.  He has tried every Biotene product he can find without any relief.  His sleep has improved.  His appetite has declined and everything tastes like "sawdust".  He has history of gastric bypass and unfortunately is unable to eat and drink at the same time so is having trouble eating solid or dry foods.  He feels up for treatment today. He has no neurologic complaints. He denies any recent fevers or illnesses. He has a good appetite and denies weight loss.  Has sore throat and mild dysphagia.  He has no chest pain, shortness of breath, cough, or hemoptysis. He denies any nausea, vomiting, constipation, or diarrhea. He has no urinary complaints.  Patient offers no further specific complaints today.  REVIEW OF SYSTEMS:   Review of Systems  Constitutional: Positive for malaise/fatigue. Negative for chills, fever and weight loss.       Change in appetite  HENT: Positive for sore throat. Negative for congestion, ear pain and tinnitus.        Trouble swallowing solid foods  Eyes: Negative.  Negative for blurred vision and double vision.  Respiratory: Negative.  Negative for cough, sputum production and shortness of breath.   Cardiovascular: Negative.  Negative for chest pain, palpitations and leg swelling.  Gastrointestinal: Negative.  Negative for abdominal pain, constipation, diarrhea, nausea and vomiting.   Genitourinary: Negative for dysuria, frequency and urgency.  Musculoskeletal: Negative for back pain and falls.  Skin: Negative.  Negative for rash.  Neurological: Negative.  Negative for weakness and headaches.  Endo/Heme/Allergies: Negative.  Does not bruise/bleed easily.  Psychiatric/Behavioral: Negative for depression. The patient has insomnia. The patient is not nervous/anxious.     As per HPI. Otherwise, a complete review of systems is negative.  PAST MEDICAL HISTORY: Past Medical History:  Diagnosis Date  . Arthritis    "right index" (03/28/2016)  . B12 deficiency   . Basal cell carcinoma of right side of nose   . Basal cell carcinoma of skin of nose    top of bridge  . Cancer of parotid gland ([place])   . Chronic back pain    "lower back and cervical spine" (03/28/2016)  . Depression    "stress related" (03/18/2016)  . GERD (gastroesophageal reflux disease)   . History of kidney stones   . Iron deficiency anemia   . Normocytic anemia   . OSA (obstructive sleep apnea)    no cpap after weight loss (03/28/2016)  . Seasonal allergies   . Ventral hernia     PAST SURGICAL HISTORY: Past Surgical History:  Procedure Laterality Date  . AIR/FLUID EXCHANGE Left 03/28/2016   Procedure: AIR/FLUID EXCHANGE;  Surgeon: Hayden Pedro, MD;  Location: [place];  Service: Ophthalmology;  Laterality: Left;  . APPENDECTOMY  2001  . BASAL CELL CARCINOMA EXCISION     "bridge of nose and right side of nose at crease"  . CATARACT EXTRACTION W/ INTRAOCULAR LENS  IMPLANT, BILATERAL Bilateral  2013-2015   left-right  . CHOLECYSTECTOMY OPEN  2001  . EXTRACORPOREAL SHOCK WAVE LITHOTRIPSY  "4-5 times"  . EYE SURGERY    . PARS PLANA VITRECTOMY Left 03/29/2016   WITH 25G REMOVAL/SUTURE INTRAOCULAR LENS   . PARS PLANA VITRECTOMY Left 03/28/2016   Procedure: PARS PLANA VITRECTOMY WITH 25G REMOVAL/SUTURE INTRAOCULAR LENS;  Surgeon: Hayden Pedro, MD;  Location: [place];  Service: Ophthalmology;   Laterality: Left;  . ROUX-EN-Y GASTRIC BYPASS  2001  . SEPTOPLASTY  1986  . TONSILLECTOMY AND ADENOIDECTOMY  1965    FAMILY HISTORY: Family History  Problem Relation Age of Onset  . Diabetes Other   . Heart disease Other   . Anemia Other   . Brain cancer Other     ADVANCED DIRECTIVES (Y/N):  N  HEALTH MAINTENANCE: Social History   Tobacco Use  . Smoking status: Never Smoker  . Smokeless tobacco: Never Used  Vaping Use  . Vaping Use: Never used  Substance Use Topics  . Alcohol use: Yes    Comment: 2 TIMES A WEEK- 1 DRINK BOURBON- NOT DRINKING WHILE ON CHEMO  . Drug use: No     Colonoscopy:  PAP:  Bone density:  Lipid panel:  Allergies  Allergen Reactions  . Penicillins Hives    Current Outpatient Medications  Medication Sig Dispense Refill  . Ascorbic Acid (VITAMIN C) 1000 MG tablet Take 1,000 mg by mouth daily.    . brimonidine (ALPHAGAN) 0.15 % ophthalmic solution Place 1 drop into the left eye 2 (two) times daily.    . Cholecalciferol (VITAMIN D) 50 MCG (2000 UT) tablet Take 2,000 Units by mouth daily.    . Cyanocobalamin (B-12) 5000 MCG CAPS Take 1 capsule by mouth daily.    . cyclobenzaprine (FLEXERIL) 10 MG tablet Take 10 mg by mouth at bedtime.  3  . Diclofenac-miSOPROStol 50-0.2 MG TBEC Take 1 tablet by mouth 2 (two) times daily as needed.    . dicyclomine (BENTYL) 20 MG tablet Take 20 mg by mouth every 6 (six) hours as needed.    . famotidine (PEPCID) 40 MG tablet Take 40 mg by mouth daily.     . ferrous sulfate 325 (65 FE) MG EC tablet Take 325 mg by mouth daily.    . Multiple Vitamin (MULTI-VITAMINS) TABS Take 1 tablet by mouth daily.    . ondansetron (ZOFRAN) 8 MG tablet Take 1 tablet (8 mg total) by mouth every 8 (eight) hours as needed for nausea or vomiting. 30 tablet 1  . prochlorperazine (COMPAZINE) 10 MG tablet TAKE 1 TABLET(10 MG) BY MOUTH EVERY 6 HOURS AS NEEDED FOR NAUSEA OR VOMITING 60 tablet 2  . rosuvastatin (CRESTOR) 5 MG tablet Take 5 mg  by mouth daily.    . sucralfate (CARAFATE) 1 GM/10ML suspension SHAKE LIQUID AND TAKE 10 ML(1 GRAM) BY MOUTH FOUR TIMES DAILY WITH MEALS AND AT BEDTIME (Patient taking differently: Take 1 g by mouth 4 (four) times daily as needed.) 420 mL 0  . traZODone (DESYREL) 50 MG tablet Take 1-2 tablets (50-100 mg total) by mouth at bedtime. 90 tablet 0  . venlafaxine XR (EFFEXOR-XR) 37.5 MG 24 hr capsule Take 37.5 mg by mouth daily.     No current facility-administered medications for this visit.   Facility-Administered Medications Ordered in Other Visits  Medication Dose Route Frequency Provider Last Rate Last Admin  . 0.9 %  sodium chloride infusion   Intravenous Once Jacquelin Hawking, NP 500 mL/hr at 05/05/20 1242  New Bag at 05/05/20 1242  . CISplatin (PLATINOL) 100 mg in sodium chloride 0.9 % 500 mL chemo infusion  41 mg/m2 Intravenous Once Lloyd Huger, MD      . dexamethasone (DECADRON) 10 mg in sodium chloride 0.9 % 50 mL IVPB  10 mg Intravenous Once Lloyd Huger, MD 204 mL/hr at 05/05/20 1240 10 mg at 05/05/20 1240  . fosaprepitant (EMEND) 150 mg in sodium chloride 0.9 % 145 mL IVPB  150 mg Intravenous Once Lloyd Huger, MD        OBJECTIVE: Vitals:   05/05/20 0915  BP: 96/67  Pulse: 67  Resp: 16  Temp: (!) 97.3 F (36.3 C)     Body mass index is 31.2 kg/m.    ECOG FS:0 - Asymptomatic  Physical Exam Constitutional:      General: Vital signs are normal.     Appearance: Normal appearance.  HENT:     Head: Normocephalic and atraumatic.  Eyes:     Pupils: Pupils are equal, round, and reactive to light.  Cardiovascular:     Rate and Rhythm: Normal rate and regular rhythm.     Heart sounds: Normal heart sounds. No murmur heard.   Pulmonary:     Effort: Pulmonary effort is normal.     Breath sounds: Normal breath sounds. No wheezing.  Abdominal:     General: Bowel sounds are normal. There is no distension.     Palpations: Abdomen is soft.     Tenderness:  There is no abdominal tenderness.  Musculoskeletal:        General: No edema. Normal range of motion.     Cervical back: Normal range of motion.  Skin:    General: Skin is warm and dry.     Findings: No rash.  Neurological:     Mental Status: He is alert and oriented to person, place, and time.  Psychiatric:        Judgment: Judgment normal.         LAB RESULTS:  Lab Results  Component Value Date   NA 137 05/05/2020   K 4.2 05/05/2020   CL 103 05/05/2020   CO2 28 05/05/2020   GLUCOSE 93 05/05/2020   BUN 12 05/05/2020   CREATININE 1.05 05/05/2020   CALCIUM 8.6 (L) 05/05/2020   PROT 6.4 03/29/2018   ALBUMIN 4.4 03/29/2018   AST 26 03/29/2018   ALT 25 03/29/2018   ALKPHOS 50 03/29/2018   BILITOT 0.5 03/29/2018   GFRNONAA >60 05/05/2020   GFRAA >60 07/26/2015    Lab Results  Component Value Date   WBC 2.2 (L) 05/05/2020   NEUTROABS 1.3 (L) 05/05/2020   HGB 10.4 (L) 05/05/2020   HCT 29.8 (L) 05/05/2020   MCV 96.8 05/05/2020   PLT 115 (L) 05/05/2020     STUDIES: No results found.  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 PET scan results from March 04, 2020 reviewed independently with no hypermetabolic activity outside the parotid gland. Given the location of the facial nerve, would like to avoid surgery if possible. Plan to give weekly cisplatin 40 mg/m2 along with daily XRT for 5-8 cycles. Patient has declined port placement at this time. 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  PLAN:    1.  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  -Treatment  held last week secondary to thrombocytopenia -Lab work from today is unremarkable.  -Proceed with cycle 5.  -RTC in 1 week for evaluation and consideration of cycle 6.   2.  Dysphagia: -Secondary to radiation. -He has tried Biotene products  without relief.  3.  Sore throat: -Secondary to radiation. -Continue healio's amino acid supplement. -Continue Carafate prn.   4.  Insomnia: -Patient is waking up every few hours due to dry mouth. -Can try trazodone 50 mg at bedtime.  5.  Hypotension: -BP 96/67 -Secondary to dehydration and lack of appetite. -Patient to get half a liter of normal saline today with his treatment.  6.  Neutropenia: -Secondary to treatment. -ANC 1.3. -Discussed neutropenic precautions.  7.  Thrombocytopenia: -Treatment held last week for platelet count of 78,000. -Improved today at 115,000. -Acceptable for treatment.  Disposition: -RTC in 1 week for cycle 7 cisplatin.  Greater than 50% was spent in counseling and coordination of care with this patient including but not limited to discussion of the relevant topics above (See A&P) including, but not limited to diagnosis and management of acute and chronic medical conditions.   Patient expressed understanding and was in agreement with this plan. He also understands that He can call clinic at any time with any questions, concerns, or complaints.   Cancer Staging Squamous cell carcinoma of parotid [facility]) Staging form: Major Salivary Glands, AJCC 8th Edition - Clinical stage from 03/05/2020: Stage II (cT2, cN0, cM0) - Signed by Lloyd Huger, MD on 03/05/2020   Jacquelin Hawking, NP   05/05/2020 12:47 PM

## 2020-05-05 NOTE — Progress Notes (Signed)
Pt eating and drinking not good. He is drinking water and hot tea. He is thinking about taking boost or ensure.

## 2020-05-06 ENCOUNTER — Ambulatory Visit
Admission: RE | Admit: 2020-05-06 | Discharge: 2020-05-06 | Disposition: A | Discharge status: Home / Self Care | Payer: Managed Care, Other (non HMO) | Source: Ambulatory Visit | Attending: Radiation Oncology | Admitting: Radiation Oncology

## 2020-05-06 DIAGNOSIS — Z510 Encounter for antineoplastic radiation therapy: Secondary | ICD-10-CM | POA: Diagnosis not present

## 2020-05-08 ENCOUNTER — Other Ambulatory Visit: Payer: Self-pay | Admitting: Oncology

## 2020-05-09 NOTE — Progress Notes (Signed)
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  INTERVAL HISTORY: Patient returns to clinic today for further evaluation and consideration of cycle 7 of weekly cisplatin.  He has a poor appetite and increased weakness and fatigue over the past week.  He also has increased nausea.  He also complains of diarrhea particularly after Ensure supplement.  He also has lost weight.  He does not complain of pain today.  He did not mention tinnitus today. He has no neurologic complaints. He denies any dysphagia.  He has no chest pain, shortness of breath, cough, or hemoptysis.  He denies any vomiting or constipation.  He has no urinary complaints.  Patient offers no further specific complaints today.    REVIEW OF SYSTEMS:   Review of Systems  Constitutional: Positive for malaise/fatigue and weight loss. Negative for fever.  HENT: Negative.  Negative for congestion and tinnitus.   Respiratory: Negative.  Negative for cough and shortness of breath.   Cardiovascular: Negative.  Negative for chest pain and leg swelling.  Gastrointestinal: Positive for diarrhea and nausea. Negative for abdominal pain.  Genitourinary: Negative.  Negative for dysuria.  Musculoskeletal: Negative.  Negative for back pain.  Skin: Negative.  Negative for rash.  Neurological: Positive for weakness. Negative for dizziness, focal weakness and headaches.  Psychiatric/Behavioral: Negative.  The patient is not nervous/anxious.     As per HPI. Otherwise, a complete review of systems is negative.  PAST MEDICAL HISTORY: Past Medical History:  Diagnosis Date  . Arthritis    "right index" (03/28/2016)  . B12 deficiency   . Basal cell carcinoma  of right side of nose   . Basal cell carcinoma of skin of nose    top of bridge  . Cancer of parotid gland ([place])   . Chronic back pain    "lower back and cervical spine" (03/28/2016)  . Depression    "stress related" (03/18/2016)  . GERD (gastroesophageal reflux disease)   . History of kidney stones   . Iron deficiency anemia   . Normocytic anemia   . OSA (obstructive sleep apnea)    no cpap after weight loss (03/28/2016)  . Seasonal allergies   . Ventral hernia     PAST SURGICAL HISTORY: Past Surgical History:  Procedure Laterality Date  . AIR/FLUID EXCHANGE Left 03/28/2016   Procedure: AIR/FLUID EXCHANGE;  Surgeon: Hayden Pedro, MD;  Location: [place];  Service: Ophthalmology;  Laterality: Left;  . APPENDECTOMY  2001  . BASAL CELL CARCINOMA EXCISION     "bridge of nose and right side of nose at crease"  . CATARACT EXTRACTION W/ INTRAOCULAR LENS  IMPLANT, BILATERAL Bilateral 2013-2015   left-right  . CHOLECYSTECTOMY OPEN  2001  . EXTRACORPOREAL SHOCK WAVE LITHOTRIPSY  "4-5 times"  . EYE SURGERY    . PARS PLANA VITRECTOMY Left 03/29/2016   WITH 25G REMOVAL/SUTURE INTRAOCULAR LENS   . PARS PLANA VITRECTOMY Left 03/28/2016   Procedure: PARS PLANA VITRECTOMY WITH 25G REMOVAL/SUTURE INTRAOCULAR LENS;  Surgeon: Hayden Pedro, MD;  Location: [place];  Service: Ophthalmology;  Laterality: Left;  . ROUX-EN-Y GASTRIC BYPASS  2001  . SEPTOPLASTY  1986  . TONSILLECTOMY AND ADENOIDECTOMY  1965    FAMILY HISTORY: Family History  Problem  Relation Age of Onset  . Diabetes Other   . Heart disease Other   . Anemia Other   . Brain cancer Other     ADVANCED DIRECTIVES (Y/N):  N  HEALTH MAINTENANCE: Social History   Tobacco Use  . Smoking status: Never Smoker  . Smokeless tobacco: Never Used  Vaping Use  . Vaping Use: Never used  Substance Use Topics  . Alcohol use: Yes    Comment: 2 TIMES A WEEK- 1 DRINK BOURBON- NOT DRINKING WHILE ON CHEMO  . Drug use: No      Colonoscopy:  PAP:  Bone density:  Lipid panel:  Allergies  Allergen Reactions  . Penicillins Hives    Current Outpatient Medications  Medication Sig Dispense Refill  . Ascorbic Acid (VITAMIN C) 1000 MG tablet Take 1,000 mg by mouth daily.    . brimonidine (ALPHAGAN) 0.15 % ophthalmic solution Place 1 drop into the left eye 2 (two) times daily.    . Cholecalciferol (VITAMIN D) 50 MCG (2000 UT) tablet Take 2,000 Units by mouth daily.    . Cyanocobalamin (B-12) 5000 MCG CAPS Take 1 capsule by mouth daily.    . cyclobenzaprine (FLEXERIL) 10 MG tablet Take 10 mg by mouth at bedtime.  3  . Diclofenac-miSOPROStol 50-0.2 MG TBEC Take 1 tablet by mouth 2 (two) times daily as needed.    . dicyclomine (BENTYL) 20 MG tablet Take 20 mg by mouth every 6 (six) hours as needed.    . famotidine (PEPCID) 40 MG tablet Take 40 mg by mouth daily.     . ferrous sulfate 325 (65 FE) MG EC tablet Take 325 mg by mouth daily.    . Multiple Vitamin (MULTI-VITAMINS) TABS Take 1 tablet by mouth daily.    . ondansetron (ZOFRAN) 8 MG tablet Take 1 tablet (8 mg total) by mouth every 8 (eight) hours as needed for nausea or vomiting. 30 tablet 1  . prochlorperazine (COMPAZINE) 10 MG tablet TAKE 1 TABLET(10 MG) BY MOUTH EVERY 6 HOURS AS NEEDED FOR NAUSEA OR VOMITING 60 tablet 2  . rosuvastatin (CRESTOR) 5 MG tablet Take 5 mg by mouth daily.    . sucralfate (CARAFATE) 1 GM/10ML suspension SHAKE LIQUID AND TAKE 10 ML(1 GRAM) BY MOUTH FOUR TIMES DAILY AT BEDTIME WITH MEALS 420 mL 0  . traZODone (DESYREL) 50 MG tablet Take 1-2 tablets (50-100 mg total) by mouth at bedtime. 90 tablet 0  . venlafaxine XR (EFFEXOR-XR) 37.5 MG 24 hr capsule Take 37.5 mg by mouth daily.     No current facility-administered medications for this visit.    OBJECTIVE: Vitals:   05/12/20 0840  BP: 100/66  Pulse: 68  Resp: 18  Temp: (!) 97.3 F (36.3 C)  SpO2: (!) 10%     Body mass index is 30.52 kg/m.    ECOG FS:0 -  Asymptomatic  General: Well-developed, well-nourished, no acute distress. Eyes: Pink conjunctiva, anicteric sclera. HEENT: Normocephalic, moist mucous membranes.  Parotid mass no longer palpable. Lungs: No audible wheezing or coughing. Heart: Regular rate and rhythm. Abdomen: Soft, nontender, no obvious distention. Musculoskeletal: No edema, cyanosis, or clubbing. Neuro: Alert, answering all questions appropriately. Cranial nerves grossly intact. Skin: No rashes or petechiae noted. Psych: Normal affect.    LAB RESULTS:  Lab Results  Component Value Date   NA 135 05/12/2020   K 4.7 05/12/2020   CL 100 05/12/2020   CO2 27 05/12/2020   GLUCOSE 67 (L) 05/12/2020   BUN 13 05/12/2020  CREATININE 1.11 05/12/2020   CALCIUM 8.8 (L) 05/12/2020   PROT 6.4 03/29/2018   ALBUMIN 4.4 03/29/2018   AST 26 03/29/2018   ALT 25 03/29/2018   ALKPHOS 50 03/29/2018   BILITOT 0.5 03/29/2018   GFRNONAA >60 05/12/2020   GFRAA >60 07/26/2015    Lab Results  Component Value Date   WBC 2.1 (L) 05/12/2020   NEUTROABS 1.0 (L) 05/12/2020   HGB 10.6 (L) 05/12/2020   HCT 30.3 (L) 05/12/2020   MCV 96.8 05/12/2020   PLT 163 05/12/2020     STUDIES: No results found.  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  PLAN:    1. 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 PET scan results from March 04, 2020 reviewed independently with no hypermetabolic activity outside the parotid gland. Given the location of the facial nerve, would like to avoid surgery if possible. Plan to give weekly cisplatin 40 mg/m2 along with daily XRT for 5-8 cycles. Patient has declined port placement at this time.  Continue daily XRT completing treatment on May 20, 2020.  Delay cycle 7 of treatment today secondary to increasing nausea and dehydration.  Patient will instead receive IV fluids and IV antiemetics.  Return to clinic in 1 week for further  evaluation and consideration of cycle 7 and his final infusion of cisplatin.  Plan to reimage with PET scan approximately 2 months after completion of his treatment in March 2022.   2.  Nausea: IV fluids and IV antiemetics as above.  Continue current antiemetics as prescribed. 3.  Facial pain/numbness: Likely secondary to irritation of facial nerve from XRT.  Monitor. 4.  Tinnitus: He does not complain of this today.  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  Possibly secondary to cisplatin.  Monitor. 5.  Sinus infection: Resolved. 6.  Diarrhea: Continue Imodium as needed. 7.  Thrombocytopenia: Resolved.   Patient expressed understanding and was in agreement with this plan. He also understands that He can call clinic at any time with any questions, concerns, or complaints.   Cancer Staging Squamous cell carcinoma of parotid [facility]) Staging form: Major Salivary Glands, AJCC 8th Edition - Clinical stage from 03/05/2020: Stage II (cT2, cN0, cM0) - Signed by Lloyd Huger, MD on 03/05/2020   Lloyd Huger, MD   05/12/2020 9:11 AM

## 2020-05-10 ENCOUNTER — Ambulatory Visit: Payer: Managed Care, Other (non HMO)

## 2020-05-11 ENCOUNTER — Ambulatory Visit
Admission: RE | Admit: 2020-05-11 | Discharge: 2020-05-11 | Disposition: A | Discharge status: Home / Self Care | Payer: Managed Care, Other (non HMO) | Source: Ambulatory Visit | Attending: Radiation Oncology | Admitting: Radiation Oncology

## 2020-05-11 DIAGNOSIS — Z510 Encounter for antineoplastic radiation therapy: Secondary | ICD-10-CM | POA: Diagnosis not present

## 2020-05-12 ENCOUNTER — Other Ambulatory Visit: Payer: Self-pay

## 2020-05-12 ENCOUNTER — Inpatient Hospital Stay (HOSPITAL_BASED_OUTPATIENT_CLINIC_OR_DEPARTMENT_OTHER): Payer: Managed Care, Other (non HMO) | Admitting: Oncology

## 2020-05-12 ENCOUNTER — Encounter: Payer: Self-pay | Admitting: Oncology

## 2020-05-12 ENCOUNTER — Ambulatory Visit
Admission: RE | Admit: 2020-05-12 | Discharge: 2020-05-12 | Disposition: A | Discharge status: Home / Self Care | Payer: Managed Care, Other (non HMO) | Source: Ambulatory Visit | Attending: Radiation Oncology | Admitting: Radiation Oncology

## 2020-05-12 ENCOUNTER — Inpatient Hospital Stay: Payer: Managed Care, Other (non HMO)

## 2020-05-12 VITALS — BP 100/66 | HR 68 | Temp 97.3°F | Resp 18 | Wt 244.2 lb

## 2020-05-12 DIAGNOSIS — C07 Malignant neoplasm of parotid gland: Secondary | ICD-10-CM

## 2020-05-12 DIAGNOSIS — Z510 Encounter for antineoplastic radiation therapy: Secondary | ICD-10-CM | POA: Diagnosis not present

## 2020-05-12 LAB — BASIC METABOLIC PANEL
Anion gap: 8 (ref 5–15)
BUN: 13 mg/dL (ref 8–23)
CO2: 27 mmol/L (ref 22–32)
Calcium: 8.8 mg/dL — ABNORMAL LOW (ref 8.9–10.3)
Chloride: 100 mmol/L (ref 98–111)
Creatinine, Ser: 1.11 mg/dL (ref 0.61–1.24)
GFR, Estimated: >60 mL/min (ref >60)
Glucose, Bld: 67 mg/dL — ABNORMAL LOW (ref 70–99)
Potassium: 4.7 mmol/L (ref 3.5–5.1)
Sodium: 135 mmol/L (ref 135–145)

## 2020-05-12 LAB — CBC WITH DIFFERENTIAL/PLATELET
Abs Immature Granulocytes: 0.01 10*3/uL (ref 0.00–0.07)
Basophils Absolute: 0 10*3/uL (ref 0.0–0.1)
Basophils Relative: 1 %
Eosinophils Absolute: 0 10*3/uL (ref 0.0–0.5)
Eosinophils Relative: 1 %
HCT: 30.3 % — ABNORMAL LOW (ref 39.0–52.0)
Hemoglobin: 10.6 g/dL — ABNORMAL LOW (ref 13.0–17.0)
Immature Granulocytes: 1 %
Lymphocytes Relative: 29 %
Lymphs Abs: 0.6 10*3/uL — ABNORMAL LOW (ref 0.7–4.0)
MCH: 33.9 pg (ref 26.0–34.0)
MCHC: 35 g/dL (ref 30.0–36.0)
MCV: 96.8 fL (ref 80.0–100.0)
Monocytes Absolute: 0.4 10*3/uL (ref 0.1–1.0)
Monocytes Relative: 18 %
Neutro Abs: 1 10*3/uL — ABNORMAL LOW (ref 1.7–7.7)
Neutrophils Relative %: 50 %
Platelets: 163 10*3/uL (ref 150–400)
RBC: 3.13 MIL/uL — ABNORMAL LOW (ref 4.22–5.81)
RDW: 15.9 % — ABNORMAL HIGH (ref 11.5–15.5)
WBC: 2.1 10*3/uL — ABNORMAL LOW (ref 4.0–10.5)
nRBC: 0 % (ref 0.0–0.2)

## 2020-05-12 LAB — MAGNESIUM: Magnesium: 2 mg/dL (ref 1.7–2.4)

## 2020-05-12 MED ORDER — ONDANSETRON HCL 4 MG/2ML IJ SOLN
8.0000 mg | Freq: Once | INTRAMUSCULAR | Status: AC
  Administered 2020-05-12: 8 mg via INTRAVENOUS
  Filled 2020-05-12: qty 4

## 2020-05-12 MED ORDER — SODIUM CHLORIDE 0.9 % IV SOLN: Freq: Once | INTRAVENOUS | Status: DC

## 2020-05-12 MED ORDER — SODIUM CHLORIDE 0.9 % IV SOLN
10.0000 mg | Freq: Once | INTRAVENOUS | Status: AC
  Administered 2020-05-12: 10 mg via INTRAVENOUS
  Filled 2020-05-12: qty 10

## 2020-05-12 MED ORDER — SODIUM CHLORIDE 0.9 % IV SOLN
Freq: Once | INTRAVENOUS | Status: AC
  Filled 2020-05-12: qty 250

## 2020-05-12 NOTE — Progress Notes (Signed)
Stable at discharge 

## 2020-05-13 ENCOUNTER — Ambulatory Visit
Admission: RE | Admit: 2020-05-13 | Discharge: 2020-05-13 | Disposition: A | Discharge status: Home / Self Care | Payer: Managed Care, Other (non HMO) | Source: Ambulatory Visit | Attending: Radiation Oncology | Admitting: Radiation Oncology

## 2020-05-13 DIAGNOSIS — Z510 Encounter for antineoplastic radiation therapy: Secondary | ICD-10-CM | POA: Diagnosis not present

## 2020-05-16 NOTE — Progress Notes (Signed)
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  INTERVAL HISTORY: Patient returns to clinic today for further evaluation and reconsideration of the seventh and final infusion of weekly cisplatin.  He continues to have weakness and fatigue, but this has improved.  His appetite is also improved.  He does not complain of any further nausea or diarrhea today. He does not complain of pain today.  He did not mention tinnitus today. He has no neurologic complaints. He denies any dysphagia.  He has no chest pain, shortness of breath, cough, or hemoptysis.  He denies any vomiting or constipation.  He has no urinary complaints.  Patient offers no further specific complaints today.  REVIEW OF SYSTEMS:   Review of Systems  Constitutional: Positive for malaise/fatigue. Negative for fever and weight loss.  HENT: Negative.  Negative for congestion and tinnitus.   Respiratory: Negative.  Negative for cough and shortness of breath.   Cardiovascular: Negative.  Negative for chest pain and leg swelling.  Gastrointestinal: Negative.  Negative for abdominal pain, diarrhea and nausea.  Genitourinary: Negative.  Negative for dysuria.  Musculoskeletal: Negative.  Negative for back pain.  Skin: Negative.  Negative for rash.  Neurological: Positive for weakness. Negative for dizziness, focal weakness and headaches.  Psychiatric/Behavioral: Negative.  The patient is not nervous/anxious.     As per HPI. Otherwise, a complete review of systems is negative.  PAST MEDICAL HISTORY: Past Medical History:  Diagnosis Date  . Arthritis    "right index" (03/28/2016)  . B12 deficiency   . Basal cell carcinoma of right side of nose    . Basal cell carcinoma of skin of nose    top of bridge  . Cancer of parotid gland (HCC)   . Chronic back pain    "lower back and cervical spine" (03/28/2016)  . Depression    "stress related" (03/18/2016)  . GERD (gastroesophageal reflux disease)   . History of kidney stones   . Iron deficiency anemia   . Normocytic anemia   . OSA (obstructive sleep apnea)    no cpap after weight loss (03/28/2016)  . Seasonal allergies   . Ventral hernia     PAST SURGICAL HISTORY: Past Surgical History:  Procedure Laterality Date  . AIR/FLUID EXCHANGE Left 03/28/2016   Procedure: AIR/FLUID EXCHANGE;  Surgeon: Sherrie George, MD;  Location: [facility] OR;  Service: Ophthalmology;  Laterality: Left;  . APPENDECTOMY  2001  . BASAL CELL CARCINOMA EXCISION     "bridge of nose and right side of nose at crease"  . CATARACT EXTRACTION W/ INTRAOCULAR LENS  IMPLANT, BILATERAL Bilateral 2013-2015   left-right  . CHOLECYSTECTOMY OPEN  2001  . EXTRACORPOREAL SHOCK WAVE LITHOTRIPSY  "4-5 times"  . EYE SURGERY    . PARS PLANA VITRECTOMY Left 03/29/2016   WITH 25G REMOVAL/SUTURE INTRAOCULAR LENS   . PARS PLANA VITRECTOMY Left 03/28/2016   Procedure: PARS PLANA VITRECTOMY WITH 25G REMOVAL/SUTURE INTRAOCULAR LENS;  Surgeon: Sherrie George, MD;  Location: [facility] OR;  Service: Ophthalmology;  Laterality: Left;  . ROUX-EN-Y GASTRIC BYPASS  2001  . SEPTOPLASTY  1986  . TONSILLECTOMY AND ADENOIDECTOMY  1965    FAMILY HISTORY: Family History  Problem Relation Age of Onset  . Diabetes  Other   . Heart disease Other   . Anemia Other   . Brain cancer Other     ADVANCED DIRECTIVES (Y/N):  N  HEALTH MAINTENANCE: Social History   Tobacco Use  . Smoking status: Never Smoker  . Smokeless tobacco: Never Used  Vaping Use  . Vaping Use: Never used  Substance Use Topics  . Alcohol use: Yes    Comment: 2 TIMES A WEEK- 1 DRINK BOURBON- NOT DRINKING WHILE ON CHEMO  . Drug use: No     Colonoscopy:  PAP:  Bone  density:  Lipid panel:  Allergies  Allergen Reactions  . Penicillins Hives    Current Outpatient Medications  Medication Sig Dispense Refill  . Ascorbic Acid (VITAMIN C) 1000 MG tablet Take 1,000 mg by mouth daily.    . brimonidine (ALPHAGAN) 0.15 % ophthalmic solution Place 1 drop into the left eye 2 (two) times daily.    . Cholecalciferol (VITAMIN D) 50 MCG (2000 UT) tablet Take 2,000 Units by mouth daily.    . Cyanocobalamin (B-12) 5000 MCG CAPS Take 1 capsule by mouth daily.    . cyclobenzaprine (FLEXERIL) 10 MG tablet Take 10 mg by mouth at bedtime.  3  . Diclofenac-miSOPROStol 50-0.2 MG TBEC Take 1 tablet by mouth 2 (two) times daily as needed.    . dicyclomine (BENTYL) 20 MG tablet Take 20 mg by mouth every 6 (six) hours as needed.    . famotidine (PEPCID) 40 MG tablet Take 40 mg by mouth daily.     . ferrous sulfate 325 (65 FE) MG EC tablet Take 325 mg by mouth daily.    . Multiple Vitamin (MULTI-VITAMINS) TABS Take 1 tablet by mouth daily.    . ondansetron (ZOFRAN) 8 MG tablet Take 1 tablet (8 mg total) by mouth every 8 (eight) hours as needed for nausea or vomiting. 30 tablet 1  . prochlorperazine (COMPAZINE) 10 MG tablet TAKE 1 TABLET(10 MG) BY MOUTH EVERY 6 HOURS AS NEEDED FOR NAUSEA OR VOMITING 60 tablet 2  . rosuvastatin (CRESTOR) 5 MG tablet Take 5 mg by mouth daily.    . sucralfate (CARAFATE) 1 GM/10ML suspension SHAKE LIQUID AND TAKE 10 ML(1 GRAM) BY MOUTH FOUR TIMES DAILY AT BEDTIME WITH MEALS 420 mL 0  . traZODone (DESYREL) 50 MG tablet Take 1-2 tablets (50-100 mg total) by mouth at bedtime. 90 tablet 0  . venlafaxine XR (EFFEXOR-XR) 37.5 MG 24 hr capsule Take 37.5 mg by mouth daily.     No current facility-administered medications for this visit.    OBJECTIVE: Vitals:   05/19/20 0918  BP: 112/83  Pulse: (!) 59  Resp: 20  Temp: (!) 96.3 F (35.7 C)     Body mass index is 30.25 kg/m.    ECOG FS:0 - Asymptomatic  General: Well-developed, well-nourished, no  acute distress. Eyes: Pink conjunctiva, anicteric sclera. HEENT: Normocephalic, moist mucous membranes.  Parotid mass with essentially resolved. Lungs: No audible wheezing or coughing. Heart: Regular rate and rhythm. Abdomen: Soft, nontender, no obvious distention. Musculoskeletal: No edema, cyanosis, or clubbing. Neuro: Alert, answering all questions appropriately. Cranial nerves grossly intact. Skin: No rashes or petechiae noted. Psych: Normal affect.  LAB RESULTS:  Lab Results  Component Value Date   NA 136 05/19/2020   K 4.7 05/19/2020   CL 101 05/19/2020   CO2 25 05/19/2020   GLUCOSE 88 05/19/2020   BUN 9 05/19/2020   CREATININE 1.02 05/19/2020   CALCIUM 8.9 05/19/2020   PROT 6.4 03/29/2018  ALBUMIN 4.4 03/29/2018   AST 26 03/29/2018   ALT 25 03/29/2018   ALKPHOS 50 03/29/2018   BILITOT 0.5 03/29/2018   GFRNONAA >60 05/19/2020   GFRAA >60 07/26/2015    Lab Results  Component Value Date   WBC 3.0 (L) 05/19/2020   NEUTROABS 1.6 (L) 05/19/2020   HGB 10.6 (L) 05/19/2020   HCT 30.1 (L) 05/19/2020   MCV 98.0 05/19/2020   PLT 150 05/19/2020     STUDIES: No results found.  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  PLAN:    1. 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 PET scan results from March 04, 2020 reviewed independently with no hypermetabolic activity outside the parotid gland. Given the location of the facial nerve, would like to avoid surgery if possible. Plan to give weekly cisplatin 40 mg/m2 along with daily XRT for 5-8 cycles. Patient has declined port placement at this time.  Patient will complete XRT tomorrow, May 20, 2020.  Proceed with his seventh and final infusion of cisplatin today.  Return to clinic in 1 month with repeat laboratory work and further evaluation. Plan to reimage with PET scan approximately 2 months after completion of his treatment in March 2022.   2.   Nausea: Improved. 3.  Facial pain/numbness: Patient does not complain of this today.  Likely secondary to irritation of facial nerve from XRT.  Monitor. 4.  Tinnitus: He does not complain of this today.  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  Possibly secondary to cisplatin.  Monitor. 5.  Sinus infection: Resolved. 6.  Diarrhea: Resolved.  Continue Imodium as needed. 7.  Thrombocytopenia: Resolved. 8.  Leukopenia: Improved, proceed with treatment as above. 9.  Anemia: Chronic and unchanged.  Patient's hemoglobin is 10.6 today.   Patient expressed understanding and was in agreement with this plan. He also understands that He can call clinic at any time with any questions, concerns, or complaints.   Cancer Staging Squamous cell carcinoma of parotid [facility]) Staging form: Major Salivary Glands, AJCC 8th Edition - Clinical stage from 03/05/2020: Stage II (cT2, cN0, cM0) - Signed by Lloyd Huger, MD on 03/05/2020   Lloyd Huger, MD   05/20/2020 6:24 AM

## 2020-05-17 ENCOUNTER — Ambulatory Visit
Admission: RE | Admit: 2020-05-17 | Discharge: 2020-05-17 | Disposition: A | Discharge status: Home / Self Care | Payer: Managed Care, Other (non HMO) | Source: Ambulatory Visit | Attending: Radiation Oncology | Admitting: Radiation Oncology

## 2020-05-17 DIAGNOSIS — R110 Nausea: Secondary | ICD-10-CM | POA: Diagnosis not present

## 2020-05-17 DIAGNOSIS — Z5111 Encounter for antineoplastic chemotherapy: Secondary | ICD-10-CM | POA: Insufficient documentation

## 2020-05-17 DIAGNOSIS — Z510 Encounter for antineoplastic radiation therapy: Secondary | ICD-10-CM | POA: Diagnosis not present

## 2020-05-17 DIAGNOSIS — C07 Malignant neoplasm of parotid gland: Secondary | ICD-10-CM | POA: Insufficient documentation

## 2020-05-18 ENCOUNTER — Ambulatory Visit
Admission: RE | Admit: 2020-05-18 | Discharge: 2020-05-18 | Disposition: A | Discharge status: Home / Self Care | Payer: Managed Care, Other (non HMO) | Source: Ambulatory Visit | Attending: Radiation Oncology | Admitting: Radiation Oncology

## 2020-05-18 ENCOUNTER — Ambulatory Visit: Payer: Managed Care, Other (non HMO)

## 2020-05-18 DIAGNOSIS — Z5111 Encounter for antineoplastic chemotherapy: Secondary | ICD-10-CM | POA: Diagnosis not present

## 2020-05-19 ENCOUNTER — Encounter: Payer: Self-pay | Admitting: Oncology

## 2020-05-19 ENCOUNTER — Ambulatory Visit: Payer: Managed Care, Other (non HMO)

## 2020-05-19 ENCOUNTER — Inpatient Hospital Stay (HOSPITAL_BASED_OUTPATIENT_CLINIC_OR_DEPARTMENT_OTHER): Payer: Managed Care, Other (non HMO) | Admitting: Oncology

## 2020-05-19 ENCOUNTER — Inpatient Hospital Stay: Payer: Managed Care, Other (non HMO) | Attending: Oncology

## 2020-05-19 ENCOUNTER — Ambulatory Visit
Admission: RE | Admit: 2020-05-19 | Discharge: 2020-05-19 | Disposition: A | Discharge status: Home / Self Care | Payer: Managed Care, Other (non HMO) | Source: Ambulatory Visit | Attending: Radiation Oncology | Admitting: Radiation Oncology

## 2020-05-19 ENCOUNTER — Other Ambulatory Visit: Payer: Self-pay

## 2020-05-19 ENCOUNTER — Inpatient Hospital Stay: Payer: Managed Care, Other (non HMO)

## 2020-05-19 VITALS — BP 112/83 | HR 59 | Temp 96.3°F | Resp 20 | Wt 242.0 lb

## 2020-05-19 DIAGNOSIS — Z5111 Encounter for antineoplastic chemotherapy: Secondary | ICD-10-CM | POA: Diagnosis not present

## 2020-05-19 DIAGNOSIS — C07 Malignant neoplasm of parotid gland: Secondary | ICD-10-CM | POA: Diagnosis not present

## 2020-05-19 DIAGNOSIS — Z510 Encounter for antineoplastic radiation therapy: Secondary | ICD-10-CM | POA: Insufficient documentation

## 2020-05-19 DIAGNOSIS — R110 Nausea: Secondary | ICD-10-CM | POA: Insufficient documentation

## 2020-05-19 LAB — CBC WITH DIFFERENTIAL/PLATELET
Abs Immature Granulocytes: 0 10*3/uL (ref 0.00–0.07)
Basophils Absolute: 0.1 10*3/uL (ref 0.0–0.1)
Basophils Relative: 2 %
Eosinophils Absolute: 0 10*3/uL (ref 0.0–0.5)
Eosinophils Relative: 1 %
HCT: 30.1 % — ABNORMAL LOW (ref 39.0–52.0)
Hemoglobin: 10.6 g/dL — ABNORMAL LOW (ref 13.0–17.0)
Immature Granulocytes: 0 %
Lymphocytes Relative: 25 %
Lymphs Abs: 0.8 10*3/uL (ref 0.7–4.0)
MCH: 34.5 pg — ABNORMAL HIGH (ref 26.0–34.0)
MCHC: 35.2 g/dL (ref 30.0–36.0)
MCV: 98 fL (ref 80.0–100.0)
Monocytes Absolute: 0.6 10*3/uL (ref 0.1–1.0)
Monocytes Relative: 19 %
Neutro Abs: 1.6 10*3/uL — ABNORMAL LOW (ref 1.7–7.7)
Neutrophils Relative %: 53 %
Platelets: 150 10*3/uL (ref 150–400)
RBC: 3.07 MIL/uL — ABNORMAL LOW (ref 4.22–5.81)
RDW: 17.6 % — ABNORMAL HIGH (ref 11.5–15.5)
WBC: 3 10*3/uL — ABNORMAL LOW (ref 4.0–10.5)
nRBC: 0 % (ref 0.0–0.2)

## 2020-05-19 LAB — BASIC METABOLIC PANEL
Anion gap: 10 (ref 5–15)
BUN: 9 mg/dL (ref 8–23)
CO2: 25 mmol/L (ref 22–32)
Calcium: 8.9 mg/dL (ref 8.9–10.3)
Chloride: 101 mmol/L (ref 98–111)
Creatinine, Ser: 1.02 mg/dL (ref 0.61–1.24)
GFR, Estimated: >60 mL/min (ref >60)
Glucose, Bld: 88 mg/dL (ref 70–99)
Potassium: 4.7 mmol/L (ref 3.5–5.1)
Sodium: 136 mmol/L (ref 135–145)

## 2020-05-19 LAB — MAGNESIUM: Magnesium: 2.1 mg/dL (ref 1.7–2.4)

## 2020-05-19 MED ORDER — SODIUM CHLORIDE 0.9 % IV SOLN
Freq: Once | INTRAVENOUS | Status: AC
  Filled 2020-05-19: qty 250

## 2020-05-19 MED ORDER — SODIUM CHLORIDE 0.9 % IV SOLN
10.0000 mg | Freq: Once | INTRAVENOUS | Status: AC
  Administered 2020-05-19: 10 mg via INTRAVENOUS
  Filled 2020-05-19: qty 1

## 2020-05-19 MED ORDER — PALONOSETRON HCL INJECTION 0.25 MG/5ML
0.2500 mg | Freq: Once | INTRAVENOUS | Status: AC
  Administered 2020-05-19: 0.25 mg via INTRAVENOUS
  Filled 2020-05-19: qty 5

## 2020-05-19 MED ORDER — SODIUM CHLORIDE 0.9 % IV SOLN
100.0000 mg | Freq: Once | INTRAVENOUS | Status: AC
  Administered 2020-05-19: 100 mg via INTRAVENOUS
  Filled 2020-05-19: qty 100

## 2020-05-19 MED ORDER — POTASSIUM CHLORIDE IN NACL 20-0.9 MEQ/L-% IV SOLN
Freq: Once | INTRAVENOUS | Status: AC
  Filled 2020-05-19: qty 1000

## 2020-05-19 MED ORDER — SODIUM CHLORIDE 0.9 % IV SOLN: Freq: Once | INTRAVENOUS | Status: DC

## 2020-05-19 MED ORDER — SODIUM CHLORIDE 0.9 % IV SOLN
150.0000 mg | Freq: Once | INTRAVENOUS | Status: AC
  Administered 2020-05-19: 150 mg via INTRAVENOUS
  Filled 2020-05-19: qty 150

## 2020-05-19 MED ORDER — MAGNESIUM SULFATE 2 GM/50ML IV SOLN
2.0000 g | Freq: Once | INTRAVENOUS | Status: AC
  Administered 2020-05-19: 2 g via INTRAVENOUS
  Filled 2020-05-19: qty 50

## 2020-05-19 NOTE — Progress Notes (Unsigned)
Patient here today for follow up regarding head and neck cancer. Patient reports nausea, decreased appetite.

## 2020-05-20 ENCOUNTER — Ambulatory Visit
Admission: RE | Admit: 2020-05-20 | Discharge: 2020-05-20 | Disposition: A | Discharge status: Home / Self Care | Payer: Managed Care, Other (non HMO) | Source: Ambulatory Visit | Attending: Radiation Oncology | Admitting: Radiation Oncology

## 2020-05-20 DIAGNOSIS — Z5111 Encounter for antineoplastic chemotherapy: Secondary | ICD-10-CM | POA: Diagnosis not present

## 2020-06-05 ENCOUNTER — Other Ambulatory Visit: Payer: Self-pay | Admitting: Oncology

## 2020-06-13 ENCOUNTER — Other Ambulatory Visit: Payer: Self-pay | Admitting: Oncology

## 2020-06-13 DIAGNOSIS — C07 Malignant neoplasm of parotid gland: Secondary | ICD-10-CM

## 2020-06-21 NOTE — Progress Notes (Signed)
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  INTERVAL HISTORY: Patient returns to clinic today for 1 month evaluation after completing his concurrent cisplatin and XRT. His appetite is improved, but he still has issues with taste. He denies any pain or dysphagia. He does not complain of weakness or fatigue today.  He has no neurologic complaints. He denies any fevers. He has no chest pain, shortness of breath, cough, or hemoptysis. He has no nausea, vomiting, constipation, or diarrhea. He has no urinary complaints. Patient offers no further specific complaints today.  REVIEW OF SYSTEMS:   Review of Systems  Constitutional: Negative.  Negative for fever, malaise/fatigue and weight loss.  HENT: Negative.  Negative for congestion and tinnitus.   Respiratory: Negative.  Negative for cough and shortness of breath.   Cardiovascular: Negative.  Negative for chest pain and leg swelling.  Gastrointestinal: Negative.  Negative for abdominal pain, diarrhea and nausea.  Genitourinary: Negative.  Negative for dysuria.  Musculoskeletal: Negative.  Negative for back pain.  Skin: Negative.  Negative for rash.  Neurological: Negative.  Negative for dizziness, focal weakness, weakness and headaches.  Psychiatric/Behavioral: Negative.  The patient is not nervous/anxious.     As per HPI. Otherwise, a complete review of systems is negative.  PAST MEDICAL HISTORY: Past Medical History:  Diagnosis Date  . Arthritis    "right index" (03/28/2016)  . B12 deficiency   . Basal cell carcinoma of right side of nose   . Basal cell carcinoma of skin of nose    top of bridge  . Cancer of parotid gland ([place])   . Chronic  back pain    "lower back and cervical spine" (03/28/2016)  . Depression    "stress related" (03/18/2016)  . GERD (gastroesophageal reflux disease)   . History of kidney stones   . Iron deficiency anemia   . Normocytic anemia   . OSA (obstructive sleep apnea)    no cpap after weight loss (03/28/2016)  . Seasonal allergies   . Ventral hernia     PAST SURGICAL HISTORY: Past Surgical History:  Procedure Laterality Date  . AIR/FLUID EXCHANGE Left 03/28/2016   Procedure: AIR/FLUID EXCHANGE;  Surgeon: Hayden Pedro, MD;  Location: [employer];  Service: Ophthalmology;  Laterality: Left;  . APPENDECTOMY  2001  . BASAL CELL CARCINOMA EXCISION     "bridge of nose and right side of nose at crease"  . CATARACT EXTRACTION W/ INTRAOCULAR LENS  IMPLANT, BILATERAL Bilateral 2013-2015   left-right  . CHOLECYSTECTOMY OPEN  2001  . EXTRACORPOREAL SHOCK WAVE LITHOTRIPSY  "4-5 times"  . EYE SURGERY    . PARS PLANA VITRECTOMY Left 03/29/2016   WITH 25G REMOVAL/SUTURE INTRAOCULAR LENS   . PARS PLANA VITRECTOMY Left 03/28/2016   Procedure: PARS PLANA VITRECTOMY WITH 25G REMOVAL/SUTURE INTRAOCULAR LENS;  Surgeon: Hayden Pedro, MD;  Location: [place];  Service: Ophthalmology;  Laterality: Left;  . ROUX-EN-Y GASTRIC BYPASS  2001  . SEPTOPLASTY  1986  . TONSILLECTOMY AND ADENOIDECTOMY  1965    FAMILY HISTORY: Family History  Problem Relation Age of Onset  . Diabetes Other   . Heart disease Other   . Anemia Other   . Brain cancer Other  ADVANCED DIRECTIVES (Y/N):  N  HEALTH MAINTENANCE: Social History   Tobacco Use  . Smoking status: Never Smoker  . Smokeless tobacco: Never Used  Vaping Use  . Vaping Use: Never used  Substance Use Topics  . Alcohol use: Yes    Comment: 2 TIMES A WEEK- 1 DRINK BOURBON- NOT DRINKING WHILE ON CHEMO  . Drug use: No     Colonoscopy:  PAP:  Bone density:  Lipid panel:  Allergies  Allergen Reactions  . Penicillins Hives    Current Outpatient  Medications  Medication Sig Dispense Refill  . Ascorbic Acid (VITAMIN C) 1000 MG tablet Take 1,000 mg by mouth daily.    . brimonidine (ALPHAGAN) 0.15 % ophthalmic solution Place 1 drop into the left eye 2 (two) times daily.    . Cholecalciferol (VITAMIN D) 50 MCG (2000 UT) tablet Take 2,000 Units by mouth daily.    . Cyanocobalamin (B-12) 5000 MCG CAPS Take 1 capsule by mouth daily.    . cyclobenzaprine (FLEXERIL) 10 MG tablet Take 10 mg by mouth at bedtime.  3  . Diclofenac-miSOPROStol 50-0.2 MG TBEC Take 1 tablet by mouth 2 (two) times daily as needed.    . dicyclomine (BENTYL) 20 MG tablet Take 20 mg by mouth every 6 (six) hours as needed.    . famotidine (PEPCID) 40 MG tablet Take 40 mg by mouth daily.     . ferrous sulfate 325 (65 FE) MG EC tablet Take 325 mg by mouth daily.    . Multiple Vitamin (MULTI-VITAMINS) TABS Take 1 tablet by mouth daily.    . ondansetron (ZOFRAN) 8 MG tablet Take 1 tablet (8 mg total) by mouth every 8 (eight) hours as needed for nausea or vomiting. 30 tablet 1  . prochlorperazine (COMPAZINE) 10 MG tablet TAKE 1 TABLET(10 MG) BY MOUTH EVERY 6 HOURS AS NEEDED FOR NAUSEA OR VOMITING 60 tablet 2  . rosuvastatin (CRESTOR) 5 MG tablet Take 5 mg by mouth daily.    . sucralfate (CARAFATE) 1 GM/10ML suspension SHAKE LIQUID AND TAKE 10 ML(1 GRAM) BY MOUTH FOUR TIMES DAILY AT BEDTIME WITH MEALS 420 mL 0  . traZODone (DESYREL) 50 MG tablet TAKE 1 TO 2 TABLETS(50 TO 100 MG) BY MOUTH AT BEDTIME 90 tablet 0  . venlafaxine XR (EFFEXOR-XR) 37.5 MG 24 hr capsule Take 37.5 mg by mouth daily.     No current facility-administered medications for this visit.    OBJECTIVE: Vitals:   06/22/20 1032  BP: 122/76  Pulse: 67  Resp: 18     Body mass index is 29.21 kg/m.    ECOG FS:0 - Asymptomatic  General: Well-developed, well-nourished, no acute distress. Eyes: Pink conjunctiva, anicteric sclera. HEENT: Normocephalic, moist mucous membranes. No palpable masses or  lymphadenopathy. Lungs: No audible wheezing or coughing. Heart: Regular rate and rhythm. Abdomen: Soft, nontender, no obvious distention. Musculoskeletal: No edema, cyanosis, or clubbing. Neuro: Alert, answering all questions appropriately. Cranial nerves grossly intact. Skin: No rashes or petechiae noted. Psych: Normal affect.  LAB RESULTS:  Lab Results  Component Value Date   NA 141 06/22/2020   K 4.4 06/22/2020   CL 110 06/22/2020   CO2 22 06/22/2020   GLUCOSE 62 (L) 06/22/2020   BUN 24 (H) 06/22/2020   CREATININE 1.37 (H) 06/22/2020   CALCIUM 8.7 (L) 06/22/2020   PROT 6.4 03/29/2018   ALBUMIN 4.4 03/29/2018   AST 26 03/29/2018   ALT 25 03/29/2018   ALKPHOS 50 03/29/2018   BILITOT 0.5 03/29/2018  GFRNONAA 57 (L) 06/22/2020   GFRAA >60 07/26/2015    Lab Results  Component Value Date   WBC 2.4 (L) 06/22/2020   NEUTROABS 1.5 (L) 06/22/2020   HGB 9.1 (L) 06/22/2020   HCT 25.7 (L) 06/22/2020   MCV 103.6 (H) 06/22/2020   PLT 128 (L) 06/22/2020     STUDIES: No results found.  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  PLAN:    1. 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 PET scan results from March 04, 2020 reviewed independently with no hypermetabolic activity outside the parotid gland. Given the location of the facial nerve, would like to avoid surgery if possible. Patient completed 7 cycles of weekly cisplatin on May 19, 2020 and daily XRT 1 day later. Plan to repeat PET scan in approximately 4 to 6 weeks. Continue follow-up with ENT as scheduled. 2.  Nausea: Resolved. 3.  Facial pain/numbness: Patient does not complain of this today.  Likely secondary to irritation of facial nerve from XRT.  Monitor. 4.  Tinnitus: He does not complain of this today.  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  Possibly secondary to cisplatin.  Monitor. 5. Pancytopenia:  Mild, monitor. 6. Renal insufficiency: Patient's creatinine has trended up slightly to 1.37. Monitor.   Patient expressed understanding and was in agreement with this plan. He also understands that He can call clinic at any time with any questions, concerns, or complaints.   Cancer Staging Squamous cell carcinoma of parotid [facility]) Staging form: Major Salivary Glands, AJCC 8th Edition - Clinical stage from 03/05/2020: Stage II (cT2, cN0, cM0) - Signed by Lloyd Huger, MD on 03/05/2020 Stage prefix: Initial diagnosis   Lloyd Huger, MD   06/23/2020 6:33 AM

## 2020-06-22 ENCOUNTER — Ambulatory Visit
Admission: RE | Admit: 2020-06-22 | Discharge: 2020-06-22 | Disposition: A | Discharge status: Home / Self Care | Payer: Managed Care, Other (non HMO) | Source: Ambulatory Visit | Attending: Radiation Oncology | Admitting: Radiation Oncology

## 2020-06-22 ENCOUNTER — Encounter: Payer: Self-pay | Admitting: Radiation Oncology

## 2020-06-22 ENCOUNTER — Inpatient Hospital Stay: Payer: Managed Care, Other (non HMO) | Attending: Oncology

## 2020-06-22 ENCOUNTER — Inpatient Hospital Stay (HOSPITAL_BASED_OUTPATIENT_CLINIC_OR_DEPARTMENT_OTHER): Payer: Managed Care, Other (non HMO) | Admitting: Oncology

## 2020-06-22 VITALS — BP 121/79 | HR 68 | Temp 96.0°F | Wt 234.0 lb

## 2020-06-22 VITALS — BP 122/76 | HR 67 | Resp 18 | Wt 233.7 lb

## 2020-06-22 DIAGNOSIS — N289 Disorder of kidney and ureter, unspecified: Secondary | ICD-10-CM | POA: Diagnosis not present

## 2020-06-22 DIAGNOSIS — C07 Malignant neoplasm of parotid gland: Secondary | ICD-10-CM | POA: Insufficient documentation

## 2020-06-22 DIAGNOSIS — Z85828 Personal history of other malignant neoplasm of skin: Secondary | ICD-10-CM | POA: Insufficient documentation

## 2020-06-22 DIAGNOSIS — D61818 Other pancytopenia: Secondary | ICD-10-CM | POA: Diagnosis not present

## 2020-06-22 DIAGNOSIS — H9319 Tinnitus, unspecified ear: Secondary | ICD-10-CM | POA: Insufficient documentation

## 2020-06-22 LAB — CBC WITH DIFFERENTIAL/PLATELET
Abs Immature Granulocytes: 0.01 10*3/uL (ref 0.00–0.07)
Basophils Absolute: 0 10*3/uL (ref 0.0–0.1)
Basophils Relative: 1 %
Eosinophils Absolute: 0.1 10*3/uL (ref 0.0–0.5)
Eosinophils Relative: 2 %
HCT: 25.7 % — ABNORMAL LOW (ref 39.0–52.0)
Hemoglobin: 9.1 g/dL — ABNORMAL LOW (ref 13.0–17.0)
Immature Granulocytes: 0 %
Lymphocytes Relative: 22 %
Lymphs Abs: 0.5 10*3/uL — ABNORMAL LOW (ref 0.7–4.0)
MCH: 36.7 pg — ABNORMAL HIGH (ref 26.0–34.0)
MCHC: 35.4 g/dL (ref 30.0–36.0)
MCV: 103.6 fL — ABNORMAL HIGH (ref 80.0–100.0)
Monocytes Absolute: 0.3 10*3/uL (ref 0.1–1.0)
Monocytes Relative: 12 %
Neutro Abs: 1.5 10*3/uL — ABNORMAL LOW (ref 1.7–7.7)
Neutrophils Relative %: 63 %
Platelets: 128 10*3/uL — ABNORMAL LOW (ref 150–400)
RBC: 2.48 MIL/uL — ABNORMAL LOW (ref 4.22–5.81)
RDW: 16.2 % — ABNORMAL HIGH (ref 11.5–15.5)
WBC: 2.4 10*3/uL — ABNORMAL LOW (ref 4.0–10.5)
nRBC: 0 % (ref 0.0–0.2)

## 2020-06-22 LAB — BASIC METABOLIC PANEL
Anion gap: 9 (ref 5–15)
BUN: 24 mg/dL — ABNORMAL HIGH (ref 8–23)
CO2: 22 mmol/L (ref 22–32)
Calcium: 8.7 mg/dL — ABNORMAL LOW (ref 8.9–10.3)
Chloride: 110 mmol/L (ref 98–111)
Creatinine, Ser: 1.37 mg/dL — ABNORMAL HIGH (ref 0.61–1.24)
GFR, Estimated: 57 mL/min — ABNORMAL LOW (ref >60)
Glucose, Bld: 62 mg/dL — ABNORMAL LOW (ref 70–99)
Potassium: 4.4 mmol/L (ref 3.5–5.1)
Sodium: 141 mmol/L (ref 135–145)

## 2020-06-22 LAB — MAGNESIUM: Magnesium: 2 mg/dL (ref 1.7–2.4)

## 2020-06-22 NOTE — Progress Notes (Signed)
Radiation Oncology Follow up Note  Name: [name]   Date:   06/22/2020 MRN:  [id number] DOB: 10-Jul-1955    This 65 y.o. male presents to the clinic today for 1 month follow-up status post concurrent chemoradiation therapy for squamous cell carcinoma of the parotid.  Area was hypermetabolic for biopsy positive invasive nonkeratinizing squamous cell carcinoma.  He is completed chemoradiation therapy and is now 1 month out from treatment.  He is doing well his dysphagia has improved no head and neck pain his taste is still significantly altered.  REFERRING PROVIDER: Rusty Aus, MD  HPI: Patient is a 65 year old male presents with a slow-growing mass in his left parotid bed.  He initially presented with a slow-growing mass in his left neck.  Lesion was PET positive core biopsy was positive for nonkeratinizing squamous cell carcinoma.  He is now 1 month out from completing concurrent chemoradiation he is doing well.  He is having no significant dysphagia or head and neck pain.  Does continue have some alteration in taste.  COMPLICATIONS OF TREATMENT: none  FOLLOW UP COMPLIANCE: keeps appointments   PHYSICAL EXAM:  BP 121/79   Pulse 68   Temp (!) 96 F (35.6 C) (Tympanic)   Wt 234 lb (106.1 kg)   BMI 29.25 kg/m  No evidence of cervical or supraclavicular adenopathy is identified.  Well-developed well-nourished patient in NAD. HEENT reveals PERLA, EOMI, discs not visualized.  Oral cavity is clear. No oral mucosal lesions are identified. Neck is clear without evidence of cervical or supraclavicular adenopathy. Lungs are clear to A&P. Cardiac examination is essentially unremarkable with regular rate and rhythm without murmur rub or thrill. Abdomen is benign with no organomegaly or masses noted. Motor sensory and DTR levels are equal and symmetric in the upper and lower extremities. Cranial nerves II through XII are grossly intact. Proprioception is intact. No peripheral adenopathy or edema  is identified. No motor or sensory levels are noted. Crude visual fields are within normal range.  RADIOLOGY RESULTS: PET CT scan to be ordered  PLAN: Present time patient is recovering well from his concurrent chemoradiation.  And pleased with his overall progress.  He will see ENT for follow-up in the next month or so.  I have asked to see him back in 3 months he will have a PET CT scan ordered.  He is continuing close follow-up care with medical oncology.  Patient knows to call with any concerns.  I would like to take this opportunity to thank you for allowing me to participate in the care of your patient.Noreene Filbert, MD

## 2020-06-23 ENCOUNTER — Ambulatory Visit: Payer: Managed Care, Other (non HMO) | Admitting: Radiation Oncology

## 2020-07-06 ENCOUNTER — Telehealth: Payer: Self-pay | Admitting: *Deleted

## 2020-07-06 NOTE — Telephone Encounter (Signed)
Are they sending this over or do I need to write something?  Faythe Casa, NP 07/06/2020 11:58 AM

## 2020-07-06 NOTE — Telephone Encounter (Signed)
Received call from patient dentist asking if patient could have dental exam, cleaning and xray. After reviewing his chart and reading Dr Gary Fleet last office note and labs results to her, I advised against cleaning and only a gentle exam since his WBC and ANC are low. She requested something in writing for this and she will fax a request for this to our office

## 2020-07-06 NOTE — Telephone Encounter (Signed)
She is supposed to fax something over

## 2020-07-08 ENCOUNTER — Inpatient Hospital Stay: Payer: Managed Care, Other (non HMO)

## 2020-07-08 NOTE — Progress Notes (Signed)
Nutrition Assessment:  Patient with stage II squamous cell carcinoma of the parotid. Past medical history of GERD, Fe deficiency anemia, roux-en-y gastric bypass 2001.   Patient completed chemothearpy on 05/19/20 and radiation on 05/20/20.  Spoke with patient via phone.  Patient reports decreased appetite, no taste and dry mouth, decreased energy level.  Reports that usually eats cereal with almond milk and splenda.  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  Lunch is salad with chicken or fish or burrito wrap of cheese and deli meat and mayo or soups/stews.  Supper is chicken of fish, shrimp and vegetable Drinks ensure original or plus occasionally.  Reports volume of food is less and can't hold very much at one time. Patient has no taste for chocolate, eggs or peanut butter.      Medications: Vit C, Vit D, B 12, pepcid, Ferrous sulfate, MVI  Labs: BUN 24, creatinine 1.37, Hgb 9.1  Anthropometrics:   Height: 75 inches Weight: 234 lb 04/02/20 255 lb BMI: 29  8% weight loss in the last 3 1/2 months  Estimated Energy Needs  Kcals: 2650 Protein: 133-178 g Fluid: 2.6 L  NUTRITION DIAGNOSIS: Inadequate oral intake related to cancer related treatment side effects as evidenced by 8% weight loss in the last 3 1/2 months and decreased appetite    INTERVENTION:  Encouraged 5-6 small mini meals including protein source vs 3 meals per day Encouraged higher calorie shake (350 calories or more) ensure plus for more nutrition Discussed ways to add calories to diet (adding more heart healthy fats) as volume of food less and weight trending down. Discussed strategies to help with taste change and will mail handout Encouraged adding moisture to foods to help with dry mouth (ie sauces, dips). Can help with flavor as well.  Will mail smoothie recipes and ensure coupons Contact information provided    MONITORING, EVALUATION, GOAL: weight trends, intake   NEXT  VISIT: March 24th in clinic  [place]. Zenia Resides, [place], [place] Registered Dietitian [phone] (mobile)

## 2020-07-09 ENCOUNTER — Inpatient Hospital Stay (HOSPITAL_BASED_OUTPATIENT_CLINIC_OR_DEPARTMENT_OTHER): Payer: Managed Care, Other (non HMO) | Admitting: Oncology

## 2020-07-09 ENCOUNTER — Other Ambulatory Visit: Payer: Self-pay

## 2020-07-09 DIAGNOSIS — C07 Malignant neoplasm of parotid gland: Secondary | ICD-10-CM | POA: Diagnosis not present

## 2020-07-09 DIAGNOSIS — M791 Myalgia, unspecified site: Secondary | ICD-10-CM

## 2020-07-09 NOTE — Progress Notes (Signed)
Virtual Visit via Video Note  I connected with [name] on 07/16/20 at  3:30 PM EST by a video enabled telemedicine application and verified that I am speaking with the correct person using two identifiers.  Location: Patient: Home Provider: Clinic    I discussed the limitations of evaluation and management by telemedicine and the availability of in person appointments. The patient expressed understanding and agreed to proceed.  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  He is followed by Dr. Grayland Ormond and he is status post 7 cycles of cisplatin which he last received on 05/19/2020.  He also received XRT.  Plan is to repeat a PET scan 4 to 6 weeks from completion of treatment.  He will follow up with ENT as scheduled.  Today, he presents for 2 days of allover joint pain mainly in his wrist, knees and neck.  States he has had some joint pain in the past but not "all at the same time.  Joint pain is typically in his shoulders and elbows.  He has been taking Tylenol with relief of his pain.  Does admit to doing some yard work a few days prior to noticing the pain.  He was unsure if this is related to his treatments for his cancer.  Observations/Objective: Review of Systems  Constitutional: Negative.  Negative for chills, fever, malaise/fatigue and weight loss.  HENT: Negative for congestion, ear pain and tinnitus.   Eyes: Negative.  Negative for blurred vision and double vision.  Respiratory: Negative.  Negative for cough, sputum production and shortness of breath.   Cardiovascular: Negative.  Negative for chest pain, palpitations and leg swelling.  Gastrointestinal: Negative.  Negative for abdominal pain, constipation, diarrhea, nausea and vomiting.  Genitourinary: Negative for dysuria, frequency and urgency.  Musculoskeletal: Positive for joint pain.  Negative for back pain and falls.  Skin: Negative.  Negative for rash.  Neurological: Negative.  Negative for weakness and headaches.  Endo/Heme/Allergies: Negative.  Does not bruise/bleed easily.  Psychiatric/Behavioral: Negative.  Negative for depression. The patient is not nervous/anxious and does not have insomnia.    Physical Exam Constitutional:      General: He is not in acute distress.    Appearance: Normal appearance. He is not ill-appearing.  Musculoskeletal:        General: No swelling, tenderness, deformity or signs of injury.  Neurological:     Mental Status: He is alert and oriented to person, place, and time.  Psychiatric:        Behavior: Behavior normal.    Assessment and Plan: Stage II squamous cell carcinoma of the parotid: -Status post 7 cycles of cisplatin last given on 05/19/2020. -Also received XRT. -Plan is to have PET scan 4 to 6 weeks post treatment.  Joint pain/muscle ache: -Appears to be resolved with Tylenol at this time. -Unclear etiology but likely related to recent yardwork. -I do not believe this is related to his treatments given they were several months ago. -Reassured patient. -I have asked him to call me if symptoms worsen. -No new prescriptions at this time.  Follow Up Instructions: -Follow-up as scheduled. -Call if symptoms fail to improve or worsen.   I discussed the assessment and treatment plan with the patient. The patient was provided an opportunity to ask questions and all were answered. The patient agreed with the plan and demonstrated an understanding of  the instructions.   The patient was advised to call back or seek an in-person evaluation if the symptoms worsen or if the condition fails to improve as anticipated.  I provided 20 minutes of face-to-face time during this encounter.   Jacquelin Hawking, NP

## 2020-07-30 NOTE — Progress Notes (Signed)
[place]  [phone] [phone]  ID: Buford Dresser OB: 1956-03-20  MR#: [id number]  KTG#:[id number]  Patient Care Team: Rusty Aus, MD as PCP - General (Internal Medicine) Lloyd Huger, MD as Consulting Physician (Oncology)  CHIEF COMPLAINT: Stage II squamous cell of the parotid.  INTERVAL HISTORY: Patient returns to clinic today for further evaluation and discussion of his imaging results.  He continues to have dry mouth, poor taste leading to decreased appetite and continued weight loss. He denies any pain or dysphagia. He does not complain of weakness or fatigue today.  He has no neurologic complaints. He denies any fevers. He has no chest pain, shortness of breath, cough, or hemoptysis. He has no nausea, vomiting, constipation, or diarrhea. He has no urinary complaints.  Patient offers no further specific complaints today.  REVIEW OF SYSTEMS:   Review of Systems  Constitutional: Positive for weight loss. Negative for fever and malaise/fatigue.  HENT: Negative.  Negative for congestion and tinnitus.   Respiratory: Negative.  Negative for cough and shortness of breath.   Cardiovascular: Negative.  Negative for chest pain and leg swelling.  Gastrointestinal: Negative.  Negative for abdominal pain, diarrhea and nausea.  Genitourinary: Negative.  Negative for dysuria.  Musculoskeletal: Negative.  Negative for back pain.  Skin: Negative.  Negative for rash.  Neurological: Negative.  Negative for dizziness, focal weakness, weakness and headaches.  Psychiatric/Behavioral: Negative.  The patient is not nervous/anxious.     As per HPI. Otherwise, a complete review of systems is negative.  PAST MEDICAL HISTORY: Past Medical History:  Diagnosis Date  . Arthritis    "right index" (03/28/2016)  . B12 deficiency   . Basal cell carcinoma of right side of nose   . Basal cell carcinoma of skin of nose    top of bridge  .  Cancer of parotid gland ([place])   . Chronic back pain    "lower back and cervical spine" (03/28/2016)  . Depression    "stress related" (03/18/2016)  . GERD (gastroesophageal reflux disease)   . History of kidney stones   . Iron deficiency anemia   . Normocytic anemia   . OSA (obstructive sleep apnea)    no cpap after weight loss (03/28/2016)  . Seasonal allergies   . Ventral hernia     PAST SURGICAL HISTORY: Past Surgical History:  Procedure Laterality Date  . AIR/FLUID EXCHANGE Left 03/28/2016   Procedure: AIR/FLUID EXCHANGE;  Surgeon: Hayden Pedro, MD;  Location: [facility];  Service: Ophthalmology;  Laterality: Left;  . APPENDECTOMY  2001  . BASAL CELL CARCINOMA EXCISION     "bridge of nose and right side of nose at crease"  . CATARACT EXTRACTION W/ INTRAOCULAR LENS  IMPLANT, BILATERAL Bilateral 2013-2015   left-right  . CHOLECYSTECTOMY OPEN  2001  . EXTRACORPOREAL SHOCK WAVE LITHOTRIPSY  "4-5 times"  . EYE SURGERY    . PARS PLANA VITRECTOMY Left 03/29/2016   WITH 25G REMOVAL/SUTURE INTRAOCULAR LENS   . PARS PLANA VITRECTOMY Left 03/28/2016   Procedure: PARS PLANA VITRECTOMY WITH 25G REMOVAL/SUTURE INTRAOCULAR LENS;  Surgeon: Hayden Pedro, MD;  Location: [facility];  Service: Ophthalmology;  Laterality: Left;  . ROUX-EN-Y GASTRIC BYPASS  2001  . SEPTOPLASTY  1986  . TONSILLECTOMY AND ADENOIDECTOMY  1965    FAMILY HISTORY: Family History  Problem Relation Age of Onset  . Diabetes Other   . Heart disease Other   . Anemia Other   . Brain  cancer Other     ADVANCED DIRECTIVES (Y/N):  N  HEALTH MAINTENANCE: Social History   Tobacco Use  . Smoking status: Never Smoker  . Smokeless tobacco: Never Used  Vaping Use  . Vaping Use: Never used  Substance Use Topics  . Alcohol use: Yes    Comment: 2 TIMES A WEEK- 1 DRINK [place]- NOT DRINKING WHILE ON CHEMO  . Drug use: No     Colonoscopy:  PAP:  Bone density:  Lipid panel:  Allergies  Allergen Reactions  .  Penicillins Hives    Current Outpatient Medications  Medication Sig Dispense Refill  . Ascorbic Acid (VITAMIN C) 1000 MG tablet Take 1,000 mg by mouth daily.    . brimonidine (ALPHAGAN) 0.15 % ophthalmic solution Place 1 drop into the left eye 2 (two) times daily.    . Cholecalciferol (VITAMIN D) 50 MCG (2000 UT) tablet Take 2,000 Units by mouth daily.    . Cyanocobalamin (B-12) 5000 MCG CAPS Take 1 capsule by mouth daily.    . cyclobenzaprine (FLEXERIL) 10 MG tablet Take 10 mg by mouth at bedtime.  3  . Diclofenac-miSOPROStol 50-0.2 MG TBEC Take 1 tablet by mouth 2 (two) times daily as needed.    . dicyclomine (BENTYL) 20 MG tablet Take 20 mg by mouth every 6 (six) hours as needed.    . famotidine (PEPCID) 40 MG tablet Take 40 mg by mouth daily.     . ferrous sulfate 325 (65 FE) MG EC tablet Take 325 mg by mouth daily.    . Multiple Vitamin (MULTI-VITAMINS) TABS Take 1 tablet by mouth daily.    . ondansetron (ZOFRAN) 8 MG tablet Take 1 tablet (8 mg total) by mouth every 8 (eight) hours as needed for nausea or vomiting. 30 tablet 1  . prochlorperazine (COMPAZINE) 10 MG tablet TAKE 1 TABLET(10 MG) BY MOUTH EVERY 6 HOURS AS NEEDED FOR NAUSEA OR VOMITING 60 tablet 2  . rosuvastatin (CRESTOR) 5 MG tablet Take 5 mg by mouth daily.    . traZODone (DESYREL) 50 MG tablet TAKE 1 TO 2 TABLETS(50 TO 100 MG) BY MOUTH AT BEDTIME 90 tablet 0  . venlafaxine XR (EFFEXOR-XR) 37.5 MG 24 hr capsule Take 37.5 mg by mouth daily.     No current facility-administered medications for this visit.    OBJECTIVE: Vitals:   08/05/20 1125  BP: (!) 137/93  Pulse: (!) 59  Resp: 16  Temp: 97.6 F (36.4 C)     Body mass index is 28.2 kg/m.    ECOG FS:0 - Asymptomatic  General: Well-developed, well-nourished, no acute distress. Eyes: Pink conjunctiva, anicteric sclera. HEENT: Normocephalic, moist mucous membranes.  No palpable lymphadenopathy. Lungs: No audible wheezing or coughing. Heart: Regular rate and  rhythm. Abdomen: Soft, nontender, no obvious distention. Musculoskeletal: No edema, cyanosis, or clubbing. Neuro: Alert, answering all questions appropriately. Cranial nerves grossly intact. Skin: No rashes or petechiae noted. Psych: Normal affect.  LAB RESULTS:  Lab Results  Component Value Date   NA 138 08/04/2020   K 4.3 08/04/2020   CL 107 08/04/2020   CO2 24 08/04/2020   GLUCOSE 92 08/04/2020   BUN 18 08/04/2020   CREATININE 1.21 08/04/2020   CALCIUM 8.7 (L) 08/04/2020   PROT 6.4 03/29/2018   ALBUMIN 4.4 03/29/2018   AST 26 03/29/2018   ALT 25 03/29/2018   ALKPHOS 50 03/29/2018   BILITOT 0.5 03/29/2018   GFRNONAA >60 08/04/2020   GFRAA >60 07/26/2015    Lab Results  Component Value Date   WBC 3.3 (L) 08/04/2020   NEUTROABS 2.1 08/04/2020   HGB 10.1 (L) 08/04/2020   HCT 29.9 (L) 08/04/2020   MCV 104.9 (H) 08/04/2020   PLT 108 (L) 08/04/2020     STUDIES: NM PET Image Restag (PS) Skull Base To Thigh  Result Date: 08/04/2020 CLINICAL DATA:  Subsequent treatment strategy for squamous cell carcinoma of the left parotid gland. Recent chemotherapy and radiation therapy with round completed in January 2022. EXAM: NUCLEAR MEDICINE PET SKULL BASE TO THIGH TECHNIQUE: 10.6 mCi F-18 FDG was injected intravenously. Full-ring PET imaging was performed from the skull base to thigh after the radiotracer. CT data was obtained and used for attenuation correction and anatomic localization. Fasting blood glucose: 71 mg/dl COMPARISON:  PET-CT from 03/04/2020 FINDINGS: Mediastinal blood pool activity: SUV max 2.5 Liver activity: SUV max NA NECK: At the site of the prior mass involving the deep inferior margin of the prostate gland, there is currently only a band of soft tissue density measuring about 0.5 cm in thickness (mass previously measuring 2.5 by 2.1 cm). This band has a maximum SUV of 2.2 (previously 14.4), which is a similar activity level to the contralateral side, without residual  hypermetabolic activity in this region. No new adenopathy or new abnormal activity in the neck. Incidental CT findings: Bilateral common carotid atherosclerotic calcification. CHEST: No significant abnormal hypermetabolic activity in this region. Incidental CT findings: Coronary, aortic arch, and branch vessel atherosclerotic vascular disease. Postoperative findings at the gastroesophageal junction. ABDOMEN/PELVIS: No significant abnormal hypermetabolic activity in this region. Incidental CT findings: Postoperative findings in the stomach compatible with prior gastric bypass. Cholecystectomy. Aortoiliac atherosclerotic vascular disease. Prostatomegaly. 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 SKELETON: No significant abnormal hypermetabolic activity in this region. Incidental CT findings: Thoracolumbar spondylosis. IMPRESSION: 1. At the site of the prior left parotid mass, there is only a thin band of residual soft tissue density which is not currently hypermetabolic. This reflects excellent response to therapy. 2. No new areas of concerning hypermetabolic activity. 3. Other imaging findings of potential clinical significance: Aortic Atherosclerosis (ICD10-I70.0). Coronary atherosclerosis. Prior gastric bypass. Prostatomegaly. Stable splenic artery aneurysm. Thoracolumbar spondylosis. Electronically Signed   By: Van Clines M.D.   On: 08/04/2020 17:04    ASSESSMENT: Stage II squamous cell of the parotid.  PLAN:    1. 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 PET scan results from March 04, 2020 reviewed independently with no hypermetabolic activity outside the parotid gland. Given the location of the facial nerve, would like to avoid surgery if possible. Patient completed 7 cycles of weekly cisplatin on May 19, 2020 and daily XRT 1 day later.  Repeat PET scan from August 04, 2020 reviewed independently and reported as above with  complete response to treatment and no evidence of recurrent or progressive disease.  No further imaging is necessary unless there is suspicion of recurrence.  Patient has follow-up with ENT later today.  Return to clinic in 3 months for further evaluation. 2.  Nausea: Resolved. 3.  Facial pain/numbness: Patient does not complain of this today.  Likely secondary to irritation of facial nerve from XRT.  Monitor. 4.  Tinnitus: He does not complain of this today.  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 5. Pancytopenia: Improving, monitor. 6. Renal insufficiency: Essentially resolved, monitor. 7.  Dry mouth/poor taste/weight loss: Likely secondary to treatment with XRT.  Continue follow-up with ENT  as above.  Patient had previously seen dietary.   Patient expressed understanding and was in agreement with this plan. He also understands that He can call clinic at any time with any questions, concerns, or complaints.   Cancer Staging Squamous cell carcinoma of parotid [facility]) Staging form: Major Salivary Glands, AJCC 8th Edition - Clinical stage from 03/05/2020: Stage II (cT2, cN0, cM0) - Signed by Lloyd Huger, MD on 03/05/2020 Stage prefix: Initial diagnosis   Lloyd Huger, MD   08/05/2020 3:36 PM

## 2020-08-04 ENCOUNTER — Other Ambulatory Visit: Payer: Self-pay

## 2020-08-04 ENCOUNTER — Ambulatory Visit
Admission: RE | Admit: 2020-08-04 | Discharge: 2020-08-04 | Disposition: A | Discharge status: Home / Self Care | Payer: Managed Care, Other (non HMO) | Source: Ambulatory Visit | Attending: Oncology | Admitting: Oncology

## 2020-08-04 ENCOUNTER — Inpatient Hospital Stay: Payer: Managed Care, Other (non HMO) | Attending: Oncology

## 2020-08-04 DIAGNOSIS — Z9884 Bariatric surgery status: Secondary | ICD-10-CM | POA: Insufficient documentation

## 2020-08-04 DIAGNOSIS — I2510 Atherosclerotic heart disease of native coronary artery without angina pectoris: Secondary | ICD-10-CM | POA: Diagnosis not present

## 2020-08-04 DIAGNOSIS — R682 Dry mouth, unspecified: Secondary | ICD-10-CM | POA: Insufficient documentation

## 2020-08-04 DIAGNOSIS — N400 Enlarged prostate without lower urinary tract symptoms: Secondary | ICD-10-CM | POA: Diagnosis not present

## 2020-08-04 DIAGNOSIS — Z9221 Personal history of antineoplastic chemotherapy: Secondary | ICD-10-CM | POA: Insufficient documentation

## 2020-08-04 DIAGNOSIS — I728 Aneurysm of other specified arteries: Secondary | ICD-10-CM | POA: Insufficient documentation

## 2020-08-04 DIAGNOSIS — C07 Malignant neoplasm of parotid gland: Secondary | ICD-10-CM | POA: Insufficient documentation

## 2020-08-04 DIAGNOSIS — Z923 Personal history of irradiation: Secondary | ICD-10-CM | POA: Diagnosis not present

## 2020-08-04 DIAGNOSIS — I700 Atherosclerosis of aorta: Secondary | ICD-10-CM | POA: Diagnosis not present

## 2020-08-04 LAB — CBC WITH DIFFERENTIAL/PLATELET
Abs Immature Granulocytes: 0 10*3/uL (ref 0.00–0.07)
Basophils Absolute: 0 10*3/uL (ref 0.0–0.1)
Basophils Relative: 1 %
Eosinophils Absolute: 0.2 10*3/uL (ref 0.0–0.5)
Eosinophils Relative: 5 %
HCT: 29.9 % — ABNORMAL LOW (ref 39.0–52.0)
Hemoglobin: 10.1 g/dL — ABNORMAL LOW (ref 13.0–17.0)
Immature Granulocytes: 0 %
Lymphocytes Relative: 21 %
Lymphs Abs: 0.7 10*3/uL (ref 0.7–4.0)
MCH: 35.4 pg — ABNORMAL HIGH (ref 26.0–34.0)
MCHC: 33.8 g/dL (ref 30.0–36.0)
MCV: 104.9 fL — ABNORMAL HIGH (ref 80.0–100.0)
Monocytes Absolute: 0.3 10*3/uL (ref 0.1–1.0)
Monocytes Relative: 10 %
Neutro Abs: 2.1 10*3/uL (ref 1.7–7.7)
Neutrophils Relative %: 63 %
Platelets: 108 10*3/uL — ABNORMAL LOW (ref 150–400)
RBC: 2.85 MIL/uL — ABNORMAL LOW (ref 4.22–5.81)
RDW: 11.2 % — ABNORMAL LOW (ref 11.5–15.5)
WBC: 3.3 10*3/uL — ABNORMAL LOW (ref 4.0–10.5)
nRBC: 0 % (ref 0.0–0.2)

## 2020-08-04 LAB — BASIC METABOLIC PANEL
Anion gap: 7 (ref 5–15)
BUN: 18 mg/dL (ref 8–23)
CO2: 24 mmol/L (ref 22–32)
Calcium: 8.7 mg/dL — ABNORMAL LOW (ref 8.9–10.3)
Chloride: 107 mmol/L (ref 98–111)
Creatinine, Ser: 1.21 mg/dL (ref 0.61–1.24)
GFR, Estimated: >60 mL/min (ref >60)
Glucose, Bld: 92 mg/dL (ref 70–99)
Potassium: 4.3 mmol/L (ref 3.5–5.1)
Sodium: 138 mmol/L (ref 135–145)

## 2020-08-04 LAB — MAGNESIUM: Magnesium: 2.2 mg/dL (ref 1.7–2.4)

## 2020-08-04 LAB — GLUCOSE, CAPILLARY: Glucose-Capillary: 71 mg/dL (ref 70–99)

## 2020-08-04 MED ORDER — FLUDEOXYGLUCOSE F - 18 (FDG) INJECTION
12.1000 | Freq: Once | INTRAVENOUS | Status: AC
  Administered 2020-08-04: 10.56 via INTRAVENOUS

## 2020-08-05 ENCOUNTER — Encounter: Payer: Self-pay | Admitting: Oncology

## 2020-08-05 ENCOUNTER — Inpatient Hospital Stay (HOSPITAL_BASED_OUTPATIENT_CLINIC_OR_DEPARTMENT_OTHER): Payer: Managed Care, Other (non HMO) | Admitting: Oncology

## 2020-08-05 ENCOUNTER — Inpatient Hospital Stay: Payer: Managed Care, Other (non HMO)

## 2020-08-05 VITALS — BP 137/93 | HR 59 | Temp 97.6°F | Resp 16 | Wt 225.6 lb

## 2020-08-05 DIAGNOSIS — Z9221 Personal history of antineoplastic chemotherapy: Secondary | ICD-10-CM | POA: Diagnosis not present

## 2020-08-05 DIAGNOSIS — Z9884 Bariatric surgery status: Secondary | ICD-10-CM | POA: Diagnosis not present

## 2020-08-05 DIAGNOSIS — R682 Dry mouth, unspecified: Secondary | ICD-10-CM | POA: Diagnosis not present

## 2020-08-05 DIAGNOSIS — C07 Malignant neoplasm of parotid gland: Secondary | ICD-10-CM | POA: Diagnosis not present

## 2020-08-05 DIAGNOSIS — Z923 Personal history of irradiation: Secondary | ICD-10-CM | POA: Diagnosis not present

## 2020-08-16 ENCOUNTER — Encounter: Payer: Self-pay | Admitting: Oncology

## 2020-09-01 ENCOUNTER — Other Ambulatory Visit: Payer: Self-pay | Admitting: Oncology

## 2020-09-01 MED ORDER — PREDNISONE 10 MG (21) PO TBPK: ORAL_TABLET | ORAL | 0 refills | Status: DC

## 2020-09-01 MED ORDER — CLARITHROMYCIN 500 MG PO TABS: 500.0000 mg | ORAL_TABLET | Freq: Two times a day (BID) | ORAL | 0 refills | Status: DC

## 2020-09-01 NOTE — Progress Notes (Signed)
RE: Sinus infection   Patient complains of congestion, facial pain, nasal congestion, no  fever and sinus pressure. Symptoms include congestion and nasal congestion with no fever, chills, night sweats or weight loss. Onset of symptoms was 5 days ago, gradually worsening since that time. He is drinking plenty of fluids.  Past history is significant for yearly sinus infection with changing of season.   Review of Systems  Constitutional: Negative for fever.  HENT: Positive for congestion and sinus pain.   Respiratory: Positive for cough and sputum production.    Patient has allergies to penicillins.  In the past he has used clarithromycin and steroids.  We will send prescriptions for clarithromycin 500 mg twice daily x7 days and a prednisone taper.  Prescription sent to [facility] per patient request.   Faythe Casa, NP 09/01/2020 4:10 PM

## 2020-09-21 ENCOUNTER — Ambulatory Visit: Payer: Managed Care, Other (non HMO) | Admitting: Radiation Oncology

## 2020-10-07 ENCOUNTER — Inpatient Hospital Stay: Payer: Managed Care, Other (non HMO) | Attending: Oncology | Admitting: Oncology

## 2020-10-07 ENCOUNTER — Other Ambulatory Visit: Payer: Self-pay

## 2020-10-07 DIAGNOSIS — U071 COVID-19: Secondary | ICD-10-CM | POA: Diagnosis not present

## 2020-10-07 MED ORDER — NIRMATRELVIR/RITONAVIR (PAXLOVID)TABLET: 3.0000 | ORAL_TABLET | Freq: Two times a day (BID) | ORAL | 0 refills | Status: AC

## 2020-10-07 NOTE — Progress Notes (Signed)
Virtual Visit via Telephone Note  I connected with [name] on 10/07/20 at 11:00 AM EDT by telephone and verified that I am speaking with the correct person using two identifiers.  Location: Patient: Home Provider: Clinic    I discussed the limitations, risks, security and privacy concerns of performing an evaluation and management service by telephone and the availability of in person appointments. I also discussed with the patient that there may be a patient responsible charge related to this service. The patient expressed understanding and agreed to proceed.   History of Present Illness: [name] is a 65 year old male with past medical history significant for iron deficiency anemia and squamous cell carcinoma of the parotid gland.  He completed 7 cycles of cisplatin and radiation in January 2022.  Patient calls clinic today with concerns of positive COVID test results.  Reports mild symptoms including runny nose and scratchy throat. Symptoms started yesterday. Has been taking benadryl without relief. Low grade temp 99.7. Occasional cough. No body aches or headache.  Calling to see about getting a prescription for an oral antiviral.   Observations/Objective: Review of Systems  Constitutional: Positive for malaise/fatigue. Negative for chills, fever and weight loss.  HENT: Positive for congestion (Runny nose) and sore throat (Scratchy throat). Negative for ear pain and tinnitus.   Eyes: Negative.  Negative for blurred vision and double vision.  Respiratory: Positive for cough (Occasional). Negative for sputum production and shortness of breath.   Cardiovascular: Negative.  Negative for chest pain, palpitations and leg swelling.  Gastrointestinal: Negative.  Negative for abdominal pain, constipation, diarrhea, nausea and vomiting.  Genitourinary: Negative for dysuria, frequency and urgency.  Musculoskeletal: Negative for back pain and falls.  Skin: Negative.  Negative for rash.   Neurological: Positive for weakness. Negative for headaches.  Endo/Heme/Allergies: Negative.  Does not bruise/bleed easily.  Psychiatric/Behavioral: Negative.  Negative for depression. The patient is not nervous/anxious and does not have insomnia.    Assessment and Plan:  COVID positive home test Symptoms started yesterday with scratchy throat, runny nose and occasional cough. Recommend Paxlovid for 5 days. Reviewed medications.  I have asked that he hold his Crestor while he completes treatment for COVID.  He may resume 24 hours after his last tablet. Patient understands and agrees. Lab work from 08/04/2020 shows normal kidney function.  GFR is greater than 60. He may receive full dose Paxlovid.  Rx sent to his pharmacy.  Follow Up Instructions: RTC as scheduled.  Call clinic if symptoms worsen or fail to improve.   I discussed the assessment and treatment plan with the patient. The patient was provided an opportunity to ask questions and all were answered. The patient agreed with the plan and demonstrated an understanding of the instructions.   The patient was advised to call back or seek an in-person evaluation if the symptoms worsen or if the condition fails to improve as anticipated.  I provided 15 minutes of non-face-to-face time during this encounter.   Jacquelin Hawking, NP

## 2020-11-02 ENCOUNTER — Encounter: Payer: Self-pay | Admitting: Oncology

## 2020-11-03 ENCOUNTER — Encounter: Payer: Self-pay | Admitting: Oncology

## 2020-11-06 NOTE — Progress Notes (Signed)
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  INTERVAL HISTORY: Patient returns to clinic today for routine 81-month evaluation.  He continues to have dry mouth leading to poor taste and weight loss, but otherwise feels well. He denies any pain or dysphagia. He does not complain of weakness or fatigue today.  He has no neurologic complaints.  He denies any recent fevers or illnesses.  He has no chest pain, shortness of breath, cough, or hemoptysis. He has no nausea, vomiting, constipation, or diarrhea. He has no urinary complaints.  Patient offers no further specific complaints today.    REVIEW OF SYSTEMS:   Review of Systems  Constitutional:  Positive for weight loss. Negative for fever and malaise/fatigue.  HENT: Negative.  Negative for congestion and tinnitus.   Respiratory: Negative.  Negative for cough and shortness of breath.   Cardiovascular: Negative.  Negative for chest pain and leg swelling.  Gastrointestinal: Negative.  Negative for abdominal pain, diarrhea and nausea.  Genitourinary: Negative.  Negative for dysuria.  Musculoskeletal: Negative.  Negative for back pain.  Skin: Negative.  Negative for rash.  Neurological: Negative.  Negative for dizziness, focal weakness, weakness and headaches.  Psychiatric/Behavioral: Negative.  The patient is not nervous/anxious.    As per HPI. Otherwise, a complete review of systems is negative.  PAST MEDICAL HISTORY: Past Medical History:  Diagnosis Date   Arthritis    "right index" (03/28/2016)   B12 deficiency    Basal cell carcinoma  of right side of nose    Basal cell carcinoma of skin of nose    top of bridge   Cancer of parotid gland (HCC)    Chronic back pain    "lower back and cervical spine" (03/28/2016)   Depression    "stress related" (03/18/2016)   GERD (gastroesophageal reflux disease)    History of kidney stones    Iron deficiency anemia    Normocytic anemia    OSA (obstructive sleep apnea)    no cpap after weight loss (03/28/2016)   Seasonal allergies    Ventral hernia     PAST SURGICAL HISTORY: Past Surgical History:  Procedure Laterality Date   AIR/FLUID EXCHANGE Left 03/28/2016   Procedure: AIR/FLUID EXCHANGE;  Surgeon: Hayden Pedro, MD;  Location: [facility];  Service: Ophthalmology;  Laterality: Left;   APPENDECTOMY  2001   BASAL CELL CARCINOMA EXCISION     "bridge of nose and right side of nose at crease"   CATARACT EXTRACTION W/ INTRAOCULAR LENS  IMPLANT, BILATERAL Bilateral 2013-2015   left-right   CHOLECYSTECTOMY OPEN  2001   EXTRACORPOREAL SHOCK WAVE LITHOTRIPSY  "4-5 times"   EYE SURGERY     PARS PLANA VITRECTOMY Left 03/29/2016   WITH 25G REMOVAL/SUTURE INTRAOCULAR LENS    PARS PLANA VITRECTOMY Left 03/28/2016   Procedure: PARS PLANA VITRECTOMY WITH 25G REMOVAL/SUTURE INTRAOCULAR LENS;  Surgeon: Hayden Pedro, MD;  Location: [facility];  Service: Ophthalmology;  Laterality: Left;   ROUX-EN-Y GASTRIC BYPASS  2001   SEPTOPLASTY  1986   TONSILLECTOMY AND ADENOIDECTOMY  1965    FAMILY HISTORY: Family History  Problem Relation Age of Onset   Diabetes Other  Heart disease Other    Anemia Other    Brain cancer Other     ADVANCED DIRECTIVES (Y/N):  N  HEALTH MAINTENANCE: Social History   Tobacco Use   Smoking status: Never   Smokeless tobacco: Never  Vaping Use   Vaping Use: Never used  Substance Use Topics   Alcohol use: Yes    Comment: 2 TIMES A WEEK- 1 DRINK BOURBON- NOT DRINKING WHILE ON CHEMO   Drug use: No     Colonoscopy:  PAP:  Bone density:  Lipid  panel:  Allergies  Allergen Reactions   Penicillins Hives    Current Outpatient Medications  Medication Sig Dispense Refill   Ascorbic Acid (VITAMIN C) 1000 MG tablet Take 1,000 mg by mouth daily.     brimonidine (ALPHAGAN) 0.15 % ophthalmic solution Place 1 drop into the left eye 2 (two) times daily.     Cholecalciferol (VITAMIN D) 50 MCG (2000 UT) tablet Take 2,000 Units by mouth daily.     Cyanocobalamin (B-12) 5000 MCG CAPS Take 1 capsule by mouth daily.     cyclobenzaprine (FLEXERIL) 10 MG tablet Take 10 mg by mouth at bedtime.  3   Diclofenac-miSOPROStol 50-0.2 MG TBEC Take 1 tablet by mouth 2 (two) times daily as needed.     dicyclomine (BENTYL) 20 MG tablet Take 20 mg by mouth every 6 (six) hours as needed.     famotidine (PEPCID) 40 MG tablet Take 40 mg by mouth daily.      ferrous sulfate 325 (65 FE) MG EC tablet Take 325 mg by mouth daily.     Multiple Vitamin (MULTI-VITAMINS) TABS Take 1 tablet by mouth daily.     rosuvastatin (CRESTOR) 5 MG tablet Take 5 mg by mouth daily.     venlafaxine XR (EFFEXOR-XR) 37.5 MG 24 hr capsule Take 37.5 mg by mouth daily.     ondansetron (ZOFRAN) 8 MG tablet Take 1 tablet (8 mg total) by mouth every 8 (eight) hours as needed for nausea or vomiting. (Patient not taking: Reported on 11/09/2020) 30 tablet 1   prochlorperazine (COMPAZINE) 10 MG tablet TAKE 1 TABLET(10 MG) BY MOUTH EVERY 6 HOURS AS NEEDED FOR NAUSEA OR VOMITING (Patient not taking: Reported on 11/09/2020) 60 tablet 2   No current facility-administered medications for this visit.    OBJECTIVE: Vitals:   11/09/20 1105  BP: 127/87  Pulse: 62  Resp: 16  Temp: (!) 96.8 F (36 C)     Body mass index is 26.71 kg/m.    ECOG FS:0 - Asymptomatic  General: Well-developed, well-nourished, no acute distress. Eyes: Pink conjunctiva, anicteric sclera. HEENT: Normocephalic, moist mucous membranes.  No palpable lymphadenopathy. Lungs: No audible wheezing or coughing. Heart: Regular rate  and rhythm. Abdomen: Soft, nontender, no obvious distention. Musculoskeletal: No edema, cyanosis, or clubbing. Neuro: Alert, answering all questions appropriately. Cranial nerves grossly intact. Skin: No rashes or petechiae noted. Psych: Normal affect.   LAB RESULTS:  Lab Results  Component Value Date   NA 138 11/09/2020   K 4.4 11/09/2020   CL 104 11/09/2020   CO2 26 11/09/2020   GLUCOSE 89 11/09/2020   BUN 22 11/09/2020   CREATININE 1.26 (H) 11/09/2020   CALCIUM 9.1 11/09/2020   PROT 6.4 03/29/2018   ALBUMIN 4.4 03/29/2018   AST 26 03/29/2018   ALT 25 03/29/2018   ALKPHOS 50 03/29/2018   BILITOT 0.5 03/29/2018   GFRNONAA >60 11/09/2020   GFRAA >60 07/26/2015    Lab Results  Component Value Date   WBC 4.0 11/09/2020   NEUTROABS 2.6 11/09/2020   HGB 11.3 (L) 11/09/2020   HCT 34.2 (L) 11/09/2020   MCV 101.2 (H) 11/09/2020   PLT 122 (L) 11/09/2020     STUDIES: No results found.   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  PLAN:    1. 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 PET scan results from March 04, 2020 reviewed independently with no hypermetabolic activity outside the parotid gland. Given the location of the facial nerve, would like to avoid surgery if possible. Patient completed 7 cycles of weekly cisplatin on May 19, 2020 and daily XRT 1 day later.  Repeat PET scan from August 04, 2020 reviewed independently with complete response to treatment and no evidence of recurrent or progressive disease.  No further imaging is necessary unless there is suspicion of recurrence.  Patient reports follow-up with ENT later today.  Return to clinic in 6 months for routine evaluation.   2.  Nausea: Resolved. 3.  Facial pain/numbness: Patient does not complain of this today.  Likely secondary to irritation of facial nerve from XRT.  Monitor. 4.  Tinnitus: He does not complain of this today.   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 5.  Anemia: Hemoglobin improving and is now 11.3.  Monitor. 6.  Thrombocytopenia: Chronic and unchanged.  Patient platelet count is 122. 7.  Renal insufficiency: Chronic and unchanged.  Patient's creatinine is 1.26 today.   8.  Dry mouth/poor taste/weight loss: Likely secondary to treatment with XRT.  Continue follow-up with ENT as above.  Patient had previously seen dietary.   Patient expressed understanding and was in agreement with this plan. He also understands that He can call clinic at any time with any questions, concerns, or complaints.   Cancer Staging Squamous cell carcinoma of parotid [facility]) Staging form: Major Salivary Glands, AJCC 8th Edition - Clinical stage from 03/05/2020: Stage II (cT2, cN0, cM0) - Signed by Lloyd Huger, MD on 03/05/2020 Stage prefix: Initial diagnosis   Lloyd Huger, MD   11/11/2020 9:50 AM

## 2020-11-09 ENCOUNTER — Inpatient Hospital Stay: Payer: Managed Care, Other (non HMO) | Attending: Oncology

## 2020-11-09 ENCOUNTER — Inpatient Hospital Stay (HOSPITAL_BASED_OUTPATIENT_CLINIC_OR_DEPARTMENT_OTHER): Payer: Managed Care, Other (non HMO) | Admitting: Oncology

## 2020-11-09 ENCOUNTER — Ambulatory Visit
Admission: RE | Admit: 2020-11-09 | Discharge: 2020-11-09 | Disposition: A | Discharge status: Home / Self Care | Payer: Self-pay | Source: Ambulatory Visit | Attending: Radiation Oncology | Admitting: Radiation Oncology

## 2020-11-09 ENCOUNTER — Encounter: Payer: Self-pay | Admitting: Oncology

## 2020-11-09 VITALS — BP 127/87 | HR 62 | Temp 96.8°F | Resp 16 | Wt 213.7 lb

## 2020-11-09 DIAGNOSIS — R682 Dry mouth, unspecified: Secondary | ICD-10-CM | POA: Insufficient documentation

## 2020-11-09 DIAGNOSIS — R634 Abnormal weight loss: Secondary | ICD-10-CM | POA: Insufficient documentation

## 2020-11-09 DIAGNOSIS — Z85818 Personal history of malignant neoplasm of other sites of lip, oral cavity, and pharynx: Secondary | ICD-10-CM | POA: Insufficient documentation

## 2020-11-09 DIAGNOSIS — Z808 Family history of malignant neoplasm of other organs or systems: Secondary | ICD-10-CM | POA: Insufficient documentation

## 2020-11-09 DIAGNOSIS — R200 Anesthesia of skin: Secondary | ICD-10-CM | POA: Insufficient documentation

## 2020-11-09 DIAGNOSIS — C07 Malignant neoplasm of parotid gland: Secondary | ICD-10-CM

## 2020-11-09 DIAGNOSIS — H9319 Tinnitus, unspecified ear: Secondary | ICD-10-CM | POA: Diagnosis not present

## 2020-11-09 DIAGNOSIS — Z923 Personal history of irradiation: Secondary | ICD-10-CM | POA: Insufficient documentation

## 2020-11-09 DIAGNOSIS — R519 Headache, unspecified: Secondary | ICD-10-CM | POA: Insufficient documentation

## 2020-11-09 DIAGNOSIS — Z85828 Personal history of other malignant neoplasm of skin: Secondary | ICD-10-CM | POA: Diagnosis not present

## 2020-11-09 DIAGNOSIS — D696 Thrombocytopenia, unspecified: Secondary | ICD-10-CM | POA: Diagnosis not present

## 2020-11-09 DIAGNOSIS — N189 Chronic kidney disease, unspecified: Secondary | ICD-10-CM | POA: Diagnosis not present

## 2020-11-09 DIAGNOSIS — D649 Anemia, unspecified: Secondary | ICD-10-CM | POA: Insufficient documentation

## 2020-11-09 DIAGNOSIS — Z9221 Personal history of antineoplastic chemotherapy: Secondary | ICD-10-CM | POA: Insufficient documentation

## 2020-11-09 LAB — CBC WITH DIFFERENTIAL/PLATELET
Abs Immature Granulocytes: 0.01 10*3/uL (ref 0.00–0.07)
Basophils Absolute: 0 10*3/uL (ref 0.0–0.1)
Basophils Relative: 1 %
Eosinophils Absolute: 0.1 10*3/uL (ref 0.0–0.5)
Eosinophils Relative: 4 %
HCT: 34.2 % — ABNORMAL LOW (ref 39.0–52.0)
Hemoglobin: 11.3 g/dL — ABNORMAL LOW (ref 13.0–17.0)
Immature Granulocytes: 0 %
Lymphocytes Relative: 21 %
Lymphs Abs: 0.8 10*3/uL (ref 0.7–4.0)
MCH: 33.4 pg (ref 26.0–34.0)
MCHC: 33 g/dL (ref 30.0–36.0)
MCV: 101.2 fL — ABNORMAL HIGH (ref 80.0–100.0)
Monocytes Absolute: 0.3 10*3/uL (ref 0.1–1.0)
Monocytes Relative: 8 %
Neutro Abs: 2.6 10*3/uL (ref 1.7–7.7)
Neutrophils Relative %: 66 %
Platelets: 122 10*3/uL — ABNORMAL LOW (ref 150–400)
RBC: 3.38 MIL/uL — ABNORMAL LOW (ref 4.22–5.81)
RDW: 13.5 % (ref 11.5–15.5)
WBC: 4 10*3/uL (ref 4.0–10.5)
nRBC: 0 % (ref 0.0–0.2)

## 2020-11-09 LAB — BASIC METABOLIC PANEL
Anion gap: 8 (ref 5–15)
BUN: 22 mg/dL (ref 8–23)
CO2: 26 mmol/L (ref 22–32)
Calcium: 9.1 mg/dL (ref 8.9–10.3)
Chloride: 104 mmol/L (ref 98–111)
Creatinine, Ser: 1.26 mg/dL — ABNORMAL HIGH (ref 0.61–1.24)
GFR, Estimated: >60 mL/min (ref >60)
Glucose, Bld: 89 mg/dL (ref 70–99)
Potassium: 4.4 mmol/L (ref 3.5–5.1)
Sodium: 138 mmol/L (ref 135–145)

## 2020-11-09 LAB — MAGNESIUM: Magnesium: 2.1 mg/dL (ref 1.7–2.4)

## 2020-11-09 NOTE — Progress Notes (Signed)
Radiation Oncology Follow up Note  Name: [name]   Date:   11/09/2020 MRN:  [id number] DOB: 08-19-1955    This 65 y.o. male presents to the clinic today for 4-month follow-up status post concurrent chemoradiation therapy for squamous cell carcinoma of the parotid for invasive nonkeratinizing squamous cell carcinoma.  REFERRING PROVIDER: Rusty Aus, MD  HPI: Patient is a 65 year old male now out 4 months having completed radiation therapy to his left parotid bed status post resection for slowly growing mass which was PET positive and biopsy positive for nonkeratinizing squamous cell carcinoma seen today in routine follow-up he is doing well.  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  He cannot drink large quantities of water since he has had gastric bypass surgery.  His weight has stabilized after some significant weight loss.  He is seeing ENT this week.  Patient had a.  PET CT scan back in March showing no evidence of hypermetabolic activity consistent with excellent response to therapy.  COMPLICATIONS OF TREATMENT: none  FOLLOW UP COMPLIANCE: keeps appointments   PHYSICAL EXAM:  There were no vitals taken for this visit. Neck is clear without evidence of cervical or supraclavicular adenopathy parotid beds bilaterally are clear.  Well-developed well-nourished patient in NAD. HEENT reveals PERLA, EOMI, discs not visualized.  Oral cavity is clear. No oral mucosal lesions are identified. Neck is clear without evidence of cervical or supraclavicular adenopathy. Lungs are clear to A&P. Cardiac examination is essentially unremarkable with regular rate and rhythm without murmur rub or thrill. Abdomen is benign with no organomegaly or masses noted. Motor sensory and DTR levels are equal and symmetric in the upper and lower extremities. Cranial nerves II through XII are grossly intact. Proprioception is intact. No peripheral adenopathy or edema is identified. No motor or sensory  levels are noted. Crude visual fields are within normal range.  RADIOLOGY RESULTS: PET scan reviewed compatible with above-stated findings  PLAN: Present time patient is doing well with no evidence of disease.  He continues close follow-up care with ENT.  I have asked to see him back in 6 months for follow-up.  I have asked him to talk to ENT about possible treatment for xerostomia.  Patient is to call with any concerns.  I would like to take this opportunity to thank you for allowing me to participate in the care of your patient.Noreene Filbert, MD

## 2020-11-09 NOTE — Progress Notes (Signed)
Survivorship Care Plan visit completed.  Treatment summary reviewed and given to patient.  ASCO answers booklet reviewed and given to patient.  CARE program and Cancer Transitions discussed with patient along with other resources cancer center offers to patients and caregivers.  Patient verbalized understanding.    

## 2020-11-09 NOTE — Progress Notes (Signed)
Patient has concerns of continued weight loss.

## 2020-11-11 ENCOUNTER — Encounter: Payer: Self-pay | Admitting: Oncology

## 2021-01-05 DIAGNOSIS — E782 Mixed hyperlipidemia: Secondary | ICD-10-CM | POA: Insufficient documentation

## 2021-01-27 ENCOUNTER — Encounter: Payer: Self-pay | Admitting: Oncology

## 2021-01-27 DIAGNOSIS — C07 Malignant neoplasm of parotid gland: Secondary | ICD-10-CM

## 2021-01-31 ENCOUNTER — Encounter: Payer: Self-pay | Admitting: Oncology

## 2021-02-04 ENCOUNTER — Encounter: Payer: Self-pay | Admitting: Internal Medicine

## 2021-02-04 ENCOUNTER — Encounter: Payer: Self-pay | Admitting: Oncology

## 2021-02-04 ENCOUNTER — Inpatient Hospital Stay: Payer: Medicare Other | Attending: Internal Medicine | Admitting: Internal Medicine

## 2021-02-04 ENCOUNTER — Ambulatory Visit: Payer: Self-pay | Admitting: Internal Medicine

## 2021-02-04 VITALS — BP 123/70 | HR 80 | Temp 96.5°F | Resp 20 | Wt 206.0 lb

## 2021-02-04 DIAGNOSIS — Z79899 Other long term (current) drug therapy: Secondary | ICD-10-CM | POA: Diagnosis not present

## 2021-02-04 DIAGNOSIS — R270 Ataxia, unspecified: Secondary | ICD-10-CM | POA: Insufficient documentation

## 2021-02-04 DIAGNOSIS — G959 Disease of spinal cord, unspecified: Secondary | ICD-10-CM

## 2021-02-04 DIAGNOSIS — C07 Malignant neoplasm of parotid gland: Secondary | ICD-10-CM | POA: Insufficient documentation

## 2021-02-04 NOTE — Progress Notes (Signed)
[facility] [place], [place] [address] [phone]   New Patient Evaluation  Date of Service: 02/04/21 Patient Name: [name] Patient MRN: [id number] Patient DOB: [birth year] Provider: Ventura Sellers, MD  Identifying Statement:  [name] is a 65 y.o. 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    Referring Provider: Lloyd Huger, MD [address] [place],  [place] [phone]  Primary Cancer:  Oncologic History: Oncology History  Squamous cell carcinoma of parotid ([place])  03/05/2020 Initial Diagnosis   Squamous cell carcinoma of parotid ([facility])   03/05/2020 Cancer Staging   Staging form: Major Salivary Glands, AJCC 8th Edition - Clinical stage from 03/05/2020: Stage II (cT2, cN0, cM0) - Signed by Lloyd Huger, MD on 03/05/2020   03/24/2020 -  Chemotherapy   The patient had palonosetron (ALOXI) injection 0.25 mg, 0.25 mg, Intravenous,  Once, 7 of 8 cycles Administration: 0.25 mg (03/24/2020), 0.25 mg (03/31/2020), 0.25 mg (04/06/2020), 0.25 mg (04/14/2020), 0.25 mg (04/21/2020), 0.25 mg (05/05/2020), 0.25 mg (05/19/2020) CISplatin (PLATINOL) 100 mg in sodium chloride 0.9 % 500 mL chemo infusion, 98 mg, Intravenous,  Once, 7 of 8 cycles Administration: 100 mg (03/24/2020), 100 mg (03/31/2020), 100 mg (04/06/2020), 100 mg (04/14/2020), 100 mg (04/21/2020), 100 mg (05/19/2020) fosaprepitant (EMEND) 150 mg in sodium chloride 0.9 % 145 mL IVPB, 150 mg, Intravenous,  Once, 7 of 8 cycles Administration: 150 mg (03/24/2020), 150 mg (03/31/2020), 150 mg (04/06/2020), 150 mg (04/14/2020), 150 mg (04/21/2020), 150 mg (05/05/2020), 150 mg (05/19/2020)   for chemotherapy treatment.       History of Present Illness: The patient's records from the referring physician were obtained and reviewed and the patient  interviewed to confirm this HPI.  [name] presents today to review symptom of imbalance.  He describes 1-2 months history of impaired balance, in particular at night.  He has not experienced any falls.  During the day, with eyes open, he does not describe difficulty.  Also complains of "pain in back of neck" at times associated with "pain running down spine and back of legs".  He previously was diagnosed with cervical spondylosis in 2010, no intervention that time.  He completed cycles of cisplatin ~8 months ago for partoid gland squamous carcinoma.  Medications: Current Outpatient Medications on File Prior to Visit  Medication Sig Dispense Refill   Ascorbic Acid (VITAMIN C) 1000 MG tablet Take 1,000 mg by mouth daily.     brimonidine (ALPHAGAN) 0.15 % ophthalmic solution Place 1 drop into the left eye 2 (two) times daily.     Cholecalciferol (VITAMIN D) 50 MCG (2000 UT) tablet Take 2,000 Units by mouth daily.     Cyanocobalamin (B-12) 5000 MCG CAPS Take 1 capsule by mouth daily.     cyclobenzaprine (FLEXERIL) 10 MG tablet Take 10 mg by mouth at bedtime.  3   Multiple Vitamin (MULTI-VITAMINS) TABS Take 1 tablet by mouth daily.     rosuvastatin (CRESTOR) 5 MG tablet Take 5 mg by mouth daily.     venlafaxine XR (EFFEXOR-XR) 37.5 MG 24 hr capsule Take 37.5 mg by mouth daily.     dicyclomine (BENTYL) 20 MG tablet Take 20 mg by mouth every 6 (six) hours as needed.     famotidine (PEPCID) 40 MG tablet Take 40 mg by mouth daily.      ferrous  sulfate 325 (65 FE) MG EC tablet Take 325 mg by mouth daily.     ondansetron (ZOFRAN) 8 MG tablet Take 1 tablet (8 mg total) by mouth every 8 (eight) hours as needed for nausea or vomiting. (Patient not taking: Reported on 11/09/2020) 30 tablet 1   prochlorperazine (COMPAZINE) 10 MG tablet TAKE 1 TABLET(10 MG) BY MOUTH EVERY 6 HOURS AS NEEDED FOR NAUSEA OR VOMITING (Patient not taking: Reported on 11/09/2020) 60 tablet 2   No current facility-administered  medications on file prior to visit.    Allergies:  Allergies  Allergen Reactions   Penicillins Hives   Past Medical History:  Past Medical History:  Diagnosis Date   Arthritis    "right index" (03/28/2016)   B12 deficiency    Basal cell carcinoma of right side of nose    Basal cell carcinoma of skin of nose    top of bridge   Cancer of parotid gland (HCC)    Chronic back pain    "lower back and cervical spine" (03/28/2016)   Depression    "stress related" (03/18/2016)   GERD (gastroesophageal reflux disease)    History of kidney stones    Iron deficiency anemia    Normocytic anemia    OSA (obstructive sleep apnea)    no cpap after weight loss (03/28/2016)   Seasonal allergies    Ventral hernia    Past Surgical History:  Past Surgical History:  Procedure Laterality Date   AIR/FLUID EXCHANGE Left 03/28/2016   Procedure: AIR/FLUID EXCHANGE;  Surgeon: Hayden Pedro, MD;  Location: [facility];  Service: Ophthalmology;  Laterality: Left;   APPENDECTOMY  2001   BASAL CELL CARCINOMA EXCISION     "bridge of nose and right side of nose at crease"   CATARACT EXTRACTION W/ INTRAOCULAR LENS  IMPLANT, BILATERAL Bilateral 2013-2015   left-right   CHOLECYSTECTOMY OPEN  2001   EXTRACORPOREAL SHOCK WAVE LITHOTRIPSY  "4-5 times"   EYE SURGERY     PARS PLANA VITRECTOMY Left 03/29/2016   WITH 25G REMOVAL/SUTURE INTRAOCULAR LENS    PARS PLANA VITRECTOMY Left 03/28/2016   Procedure: PARS PLANA VITRECTOMY WITH 25G REMOVAL/SUTURE INTRAOCULAR LENS;  Surgeon: Hayden Pedro, MD;  Location: [facility];  Service: Ophthalmology;  Laterality: Left;   ROUX-EN-Y GASTRIC BYPASS  2001   SEPTOPLASTY  1986   TONSILLECTOMY AND ADENOIDECTOMY  1965   Social History:  Social History   Socioeconomic History   Marital status: Married    Spouse name: Not on file   Number of children: Not on file   Years of education: Not on file   Highest education level: Not on file  Occupational History   Not on file   Tobacco Use   Smoking status: Never   Smokeless tobacco: Never  Vaping Use   Vaping Use: Never used  Substance and Sexual Activity   Alcohol use: Yes    Comment: 2 TIMES A WEEK- 1 DRINK BOURBON- NOT DRINKING WHILE ON CHEMO   Drug use: No   Sexual activity: Yes  Other Topics Concern   Not on file  Social History Narrative   Not on file   Social Determinants of Health   Financial Resource Strain: Not on file  Food Insecurity: Not on file  Transportation Needs: Not on file  Physical Activity: Not on file  Stress: Not on file  Social Connections: Not on file  Intimate Partner Violence: Not on file   Family History:  Family History  Problem Relation  Age of Onset   Diabetes Other    Heart disease Other    Anemia Other    Brain cancer Other     Review of Systems: Constitutional: Doesn't report fevers, chills or abnormal weight loss Eyes: Doesn't report blurriness of vision Ears, nose, mouth, throat, and face: Doesn't report sore throat Respiratory: Doesn't report cough, dyspnea or wheezes Cardiovascular: Doesn't report palpitation, chest discomfort  Gastrointestinal:  Doesn't report nausea, constipation, diarrhea GU: Doesn't report incontinence Skin: Doesn't report skin rashes Neurological: Per HPI Musculoskeletal: +diffuse joint pain Behavioral/Psych: Doesn't report anxiety  Physical Exam: Vitals:   02/04/21 0927  BP: 123/70  Pulse: 80  Resp: 20  Temp: (!) 96.5 F (35.8 C)  SpO2: 100%   KPS: 80. General: Alert, cooperative, pleasant, in no acute distress Head: Normal EENT: No conjunctival injection or scleral icterus.  Lungs: Resp effort normal Cardiac: Regular rate Abdomen: Non-distended abdomen Skin: No rashes cyanosis or petechiae. Extremities: No clubbing or edema  Neurologic Exam: Mental Status: Awake, alert, attentive to examiner. Oriented to self and environment. Language is fluent with intact comprehension.  Cranial Nerves: Visual acuity is  grossly normal. Visual fields are full. Extra-ocular movements intact. No ptosis. Face is symmetric Motor: Tone and bulk are normal. Power is full in both arms and legs. Reflexes are diminished, no pathologic reflexes present.  Sensory: Intact to light touch, but impaired propioception.  Romberg+ Gait: Normal with eyes open, subjective dystaxia with eyes closed.   Labs: I have reviewed the data as listed    Component Value Date/Time   NA 138 11/09/2020 1021   K 4.4 11/09/2020 1021   CL 104 11/09/2020 1021   CO2 26 11/09/2020 1021   GLUCOSE 89 11/09/2020 1021   BUN 22 11/09/2020 1021   CREATININE 1.26 (H) 11/09/2020 1021   CALCIUM 9.1 11/09/2020 1021   PROT 6.4 03/29/2018 0713   ALBUMIN 4.4 03/29/2018 0713   AST 26 03/29/2018 0713   ALT 25 03/29/2018 0713   ALKPHOS 50 03/29/2018 0713   BILITOT 0.5 03/29/2018 0713   GFRNONAA >60 11/09/2020 1021   GFRAA >60 07/26/2015 1206   Lab Results  Component Value Date   WBC 4.0 11/09/2020   NEUTROABS 2.6 11/09/2020   HGB 11.3 (L) 11/09/2020   HCT 34.2 (L) 11/09/2020   MCV 101.2 (H) 11/09/2020   PLT 122 (L) 11/09/2020    Assessment/Plan 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  This is supported by subjective complains, objective findings on exam, and known prior history.  Exposure to localized neck radiotherapy and platinum based chemotherapy are likely exacerbating factors at play.  We recommended he obtain an MRI of the cervical spine with without contrast, given malignancy history.  He would like to obtain this scan closer to home.  Once completed, he will have a copy sent to [place] for review.  We can review imaging in brain/spine tumor board, and then reach out to him via phone for results and further instructions.  We spent twenty additional minutes teaching regarding the natural history, biology, and historical experience in  the treatment of neurologic complications of cancer.   We appreciate the opportunity to participate in the care of [name].   All questions were answered. The patient knows to call the clinic with any problems, questions or concerns. No barriers to learning were detected.  The total time spent in the encounter was  40 minutes and more than 50% was on counseling and review of test results   Ventura Sellers, MD Medical Director of Neuro-Oncology [facility] [place] 02/04/21 10:03 AM

## 2021-03-29 ENCOUNTER — Telehealth: Payer: Self-pay | Admitting: Oncology

## 2021-03-29 ENCOUNTER — Telehealth: Payer: Self-pay | Admitting: *Deleted

## 2021-03-29 ENCOUNTER — Encounter: Payer: Self-pay | Admitting: Oncology

## 2021-03-29 NOTE — Telephone Encounter (Signed)
Called patient, left voicemail per Woodfin Ganja unlikely mets but continue to followup with Dermatologist physician.

## 2021-03-29 NOTE — Telephone Encounter (Signed)
Patient called reporting that he went to dermatologist down at the [facility] where he now resides and had biopsy done on left side of face, same side as his parotid tumor squamous cell carcinoma. 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 His Derm physician is now wandering of the parotid is a met and is going to request records form our office and also wanted t to inform Dr Grayland Ormond of findings to get his input on situation. Please advise

## 2021-04-01 ENCOUNTER — Other Ambulatory Visit: Payer: Self-pay | Admitting: Unknown Physician Specialty

## 2021-04-01 DIAGNOSIS — C07 Malignant neoplasm of parotid gland: Secondary | ICD-10-CM

## 2021-04-28 ENCOUNTER — Encounter: Payer: Self-pay | Admitting: Oncology

## 2021-05-03 ENCOUNTER — Ambulatory Visit
Admission: RE | Admit: 2021-05-03 | Discharge: 2021-05-03 | Disposition: A | Discharge status: Home / Self Care | Payer: Medicare Other | Source: Ambulatory Visit | Attending: Unknown Physician Specialty | Admitting: Unknown Physician Specialty

## 2021-05-03 ENCOUNTER — Telehealth: Payer: Self-pay | Admitting: Oncology

## 2021-05-03 ENCOUNTER — Other Ambulatory Visit: Payer: Self-pay

## 2021-05-03 DIAGNOSIS — Z08 Encounter for follow-up examination after completed treatment for malignant neoplasm: Secondary | ICD-10-CM | POA: Insufficient documentation

## 2021-05-03 DIAGNOSIS — I728 Aneurysm of other specified arteries: Secondary | ICD-10-CM | POA: Diagnosis not present

## 2021-05-03 DIAGNOSIS — C07 Malignant neoplasm of parotid gland: Secondary | ICD-10-CM | POA: Diagnosis present

## 2021-05-03 LAB — GLUCOSE, CAPILLARY
Glucose-Capillary: 61 mg/dL — ABNORMAL LOW (ref 70–99)
Glucose-Capillary: 63 mg/dL — ABNORMAL LOW (ref 70–99)

## 2021-05-03 MED ORDER — FLUDEOXYGLUCOSE F - 18 (FDG) INJECTION
10.7000 | Freq: Once | INTRAVENOUS | Status: AC | PRN
  Administered 2021-05-03: 13:00:00 11.37 via INTRAVENOUS

## 2021-05-03 MED ORDER — FLUDEOXYGLUCOSE F - 18 (FDG) INJECTION: 10.7000 | Freq: Once | INTRAVENOUS | Status: DC

## 2021-05-03 NOTE — Telephone Encounter (Signed)
Pt called to cancel appt for 05-19-21. He is having surgery at [facility]. Will call when comes home.

## 2021-05-12 ENCOUNTER — Encounter: Payer: Self-pay | Admitting: Oncology

## 2021-05-19 ENCOUNTER — Ambulatory Visit: Payer: Self-pay | Admitting: Oncology

## 2021-05-19 ENCOUNTER — Ambulatory Visit: Payer: Self-pay | Admitting: Radiation Oncology

## 2021-05-19 ENCOUNTER — Other Ambulatory Visit: Payer: Self-pay

## 2021-07-06 DIAGNOSIS — S12290S Other displaced fracture of third cervical vertebra, sequela: Secondary | ICD-10-CM | POA: Insufficient documentation

## 2021-07-20 ENCOUNTER — Telehealth: Payer: Self-pay | Admitting: *Deleted

## 2021-07-20 NOTE — Telephone Encounter (Signed)
Patient needs to re-schedule missed appointment from 05/19/2021 with Dr. Baruch Gouty and Dr/ Grayland Ormond. ?

## 2021-07-27 ENCOUNTER — Encounter: Payer: Self-pay | Admitting: Oncology

## 2021-08-10 ENCOUNTER — Other Ambulatory Visit: Payer: Self-pay | Admitting: *Deleted

## 2021-08-10 DIAGNOSIS — C07 Malignant neoplasm of parotid gland: Secondary | ICD-10-CM

## 2021-08-14 NOTE — Progress Notes (Signed)
Continue ?[place]  ?[phone] [phone] ? ?ID: [name] OB: 1955-10-27  MR#: [id number]  XKG#:[id number] ? ?Patient Care Team: ?Rusty Aus, MD as PCP - General (Internal Medicine) ?Noreene Filbert, MD as Referring Physician (Radiation Oncology) ?Beverly Gust, MD (Otolaryngology) ?Lloyd Huger, MD as Consulting Physician (Oncology) ? 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 ? ?INTERVAL HISTORY: Patient returns to clinic today for routine 43-monthevaluation.  He recent required multiple surgeries for cervical neck infusion and is in a neck brace, but hopes to have this removed tomorrow.  He denies any pain or difficulty swallowing.  He continues to have occasional dry mouth.  He has no neurologic complaints.  He denies any recent fevers or illnesses.  He has no chest pain, shortness of breath, cough, or hemoptysis. He has no nausea, vomiting, constipation, or diarrhea. He has no urinary complaints.  Patient offers no further specific complaints today. ? ?REVIEW OF SYSTEMS:   ?Review of Systems  ?Constitutional: Negative.  Negative for fever, malaise/fatigue and weight loss.  ?HENT: Negative.  Negative for congestion and tinnitus.   ?Respiratory: Negative.  Negative for cough and shortness of breath.   ?Cardiovascular: Negative.  Negative for chest pain and leg swelling.  ?Gastrointestinal: Negative.  Negative for abdominal pain, diarrhea and nausea.  ?Genitourinary: Negative.  Negative for dysuria.  ?Musculoskeletal: Negative.  Negative for back pain.  ?Skin: Negative.  Negative for rash.  ?Neurological: Negative.  Negative for dizziness, focal weakness, weakness and headaches.  ?Psychiatric/Behavioral: Negative.  The patient is not nervous/anxious.   ? ?As per HPI. Otherwise, a complete review of systems is negative. ? ?PAST MEDICAL HISTORY: ?Past Medical History:  ?Diagnosis Date  ? Arthritis   ? "right index" (03/28/2016)  ? B12  deficiency   ? Basal cell carcinoma of right side of nose   ? Basal cell carcinoma of skin of nose   ? top of bridge  ? Cancer of parotid gland ([facility]   ? Chronic back pain   ? "lower back and cervical spine" (03/28/2016)  ? Depression   ? "stress related" (03/18/2016)  ? GERD (gastroesophageal reflux disease)   ? History of kidney stones   ? Iron deficiency anemia   ? Normocytic anemia   ? OSA (obstructive sleep apnea)   ? no cpap after weight loss (03/28/2016)  ? Seasonal allergies   ? Ventral hernia   ? ? ?PAST SURGICAL HISTORY: ?Past Surgical History:  ?Procedure Laterality Date  ? AIR/FLUID EXCHANGE Left 03/28/2016  ? Procedure: AIR/FLUID EXCHANGE;  Surgeon: JHayden Pedro MD;  [facility]  Service: Ophthalmology;  Laterality: Left;  ? APPENDECTOMY  2001  ? BASAL CELL CARCINOMA EXCISION    ? "bridge of nose and right side of nose at crease"  ? CATARACT EXTRACTION W/ INTRAOCULAR LENS  IMPLANT, BILATERAL Bilateral 2013-2015  ? left-right  ? CHOLECYSTECTOMY OPEN  2001  ? EXTRACORPOREAL SHOCK WAVE LITHOTRIPSY  "4-5 times"  ? EYE SURGERY    ? PARS PLANA VITRECTOMY Left 03/29/2016  ? WITH 25G REMOVAL/SUTURE INTRAOCULAR LENS   ? PARS PLANA VITRECTOMY Left 03/28/2016  ? Procedure: PARS PLANA VITRECTOMY WITH 25G REMOVAL/SUTURE INTRAOCULAR LENS;  Surgeon: JHayden Pedro MD;  [facility]  Service: Ophthalmology;  Laterality: Left;  ? ROUX-EN-Y GASTRIC BYPASS  2001  ? SEPTOPLASTY  1986  ? [facility] ? ? ?FAMILY HISTORY: ?Family History  ?Problem Relation Age of Onset  ?  Diabetes Other   ? Heart disease Other   ? Anemia Other   ? Brain cancer Other   ? ? ?ADVANCED DIRECTIVES (Y/N):  N ? ?HEALTH MAINTENANCE: ?Social History  ? ?Tobacco Use  ? Smoking status: Never  ? Smokeless tobacco: Never  ?Vaping Use  ? Vaping Use: Never used  ?Substance Use Topics  ? Alcohol use: Yes  ?  Comment: 2 TIMES A WEEK- 1 DRINK [place]- NOT DRINKING WHILE ON CHEMO  ? Drug use: No   ? ? ? Colonoscopy: ? PAP: ? Bone density: ? Lipid panel: ? ?Allergies  ?Allergen Reactions  ? Penicillins Hives  ? ? ?Current Outpatient Medications  ?Medication Sig Dispense Refill  ? acetaminophen (TYLENOL) 500 MG tablet Take by mouth.    ? Ascorbic Acid (VITAMIN C) 1000 MG tablet Take 1,000 mg by mouth daily.    ? brimonidine (ALPHAGAN) 0.15 % ophthalmic solution Place 1 drop into the left eye 2 (two) times daily.    ? Calcium Citrate-Vitamin D 315-5 MG-MCG TABS Take 1 tablet by mouth daily.    ? Cholecalciferol (VITAMIN D) 50 MCG (2000 UT) tablet Take 2,000 Units by mouth daily.    ? Cholecalciferol 50 MCG (2000 UT) CAPS Take by mouth.    ? Cyanocobalamin (B-12) 5000 MCG CAPS Take 1 capsule by mouth daily.    ? Iron-Vitamin C (VITRON-C PO) Take by mouth.    ? ketoconazole (NIZORAL) 2 % cream     ? ketoconazole (NIZORAL) 2 % shampoo     ? loratadine (CLARITIN) 10 MG tablet Take 10 mg by mouth daily.    ? methocarbamol (ROBAXIN) 750 MG tablet Take by mouth.    ? Multiple Vitamin (MULTI-VITAMINS) TABS Take 1 tablet by mouth daily.    ? rosuvastatin (CRESTOR) 5 MG tablet Take 5 mg by mouth daily.    ? Sodium Fluoride (FLUORIDEX) 1.1 % PSTE Place onto teeth.    ? venlafaxine XR (EFFEXOR-XR) 37.5 MG 24 hr capsule Take 37.5 mg by mouth daily.    ? ?No current facility-administered medications for this visit.  ? ? ?OBJECTIVE: ?Vitals:  ? 08/17/21 1022  ?BP: 118/84  ?Pulse: 61  ?Resp: 16  ?Temp: (!) 97.3 ?F (36.3 ?C)  ?SpO2: 99%  ?   Body mass index is 25.75 kg/m?Marland Kitchen    ECOG FS:0 - Asymptomatic ? ?General: Well-developed, well-nourished, no acute distress. ?Eyes: Pink conjunctiva, anicteric sclera. ?HEENT: Normocephalic, moist mucous membranes.  Patient in neck brace. ?Lungs: No audible wheezing or coughing. ?Heart: Regular rate and rhythm. ?Abdomen: Soft, nontender, no obvious distention. ?Musculoskeletal: No edema, cyanosis, or clubbing. ?Neuro: Alert, answering all questions appropriately. Cranial nerves grossly  intact. ?Skin: No rashes or petechiae noted. ?Psych: Normal affect. ? ?LAB RESULTS: ? ?Lab Results  ?Component Value Date  ? NA 136 08/17/2021  ? K 3.8 08/17/2021  ? CL 105 08/17/2021  ? CO2 27 08/17/2021  ? GLUCOSE 84 08/17/2021  ? BUN 19 08/17/2021  ? CREATININE 1.05 08/17/2021  ? CALCIUM 8.7 (L) 08/17/2021  ? PROT 7.0 08/17/2021  ? ALBUMIN 4.0 08/17/2021  ? AST 37 08/17/2021  ? ALT 32 08/17/2021  ? ALKPHOS 56 08/17/2021  ? BILITOT 0.4 08/17/2021  ? GFRNONAA >60 08/17/2021  ? GFRAA >60 07/26/2015  ? ? ?Lab Results  ?Component Value Date  ? WBC 4.1 08/17/2021  ? NEUTROABS 2.5 08/17/2021  ? HGB 11.2 (L) 08/17/2021  ? HCT 34.6 (L) 08/17/2021  ? MCV 99.4 08/17/2021  ? PLT 177  08/17/2021  ? ? ? ?STUDIES: ?No results found. ? ? 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 ? ?PLAN:   ? ?1. 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 PET scan results from March 04, 2020 reviewed independently with no hypermetabolic activity outside the parotid gland. Given the location of the facial nerve, would like to avoid surgery if possible. Patient completed 7 cycles of weekly cisplatin on May 19, 2020 and daily XRT one day later.  Repeat PET scan from August 04, 2020 reviewed independently with complete response to treatment and no evidence of recurrent or progressive disease.  No further imaging is necessary unless there is suspicion of recurrence.  Patient reports follow-up with ENT later today for endoscopy.  Return to clinic in 6 months for routine evaluation.   ?2.  Neck cervical surgery: Patient reports he possibly may get his neck brace off tomorrow which will be followed by physical therapy. ?3.  Tinnitus: He does not complain of this today.  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 ?4.  Anemia: Chronic and unchanged.  Patient's hemoglobin is 11.2.   ?5.  Thrombocytopenia: Resolved. ?7.  Renal insufficiency: Resolved.   ?8.  Dry  mouth: Chronic and unchanged.  Likely secondary to XRT.  Continue follow-up with ENT as scheduled. ? ? ?Patient expressed understanding and was in agreement with this plan. He also understands that He can call clinic at any time with any

## 2021-08-17 ENCOUNTER — Ambulatory Visit
Admission: RE | Admit: 2021-08-17 | Discharge: 2021-08-17 | Disposition: A | Discharge status: Home / Self Care | Payer: Medicare Other | Source: Ambulatory Visit | Attending: Radiation Oncology | Admitting: Radiation Oncology

## 2021-08-17 ENCOUNTER — Encounter: Payer: Self-pay | Admitting: Oncology

## 2021-08-17 ENCOUNTER — Inpatient Hospital Stay: Payer: Medicare Other

## 2021-08-17 ENCOUNTER — Inpatient Hospital Stay: Payer: Medicare Other | Attending: Oncology | Admitting: Oncology

## 2021-08-17 VITALS — BP 118/84 | HR 61 | Temp 97.3°F | Resp 16 | Ht 75.0 in | Wt 206.0 lb

## 2021-08-17 DIAGNOSIS — D649 Anemia, unspecified: Secondary | ICD-10-CM | POA: Insufficient documentation

## 2021-08-17 DIAGNOSIS — R1310 Dysphagia, unspecified: Secondary | ICD-10-CM | POA: Insufficient documentation

## 2021-08-17 DIAGNOSIS — E538 Deficiency of other specified B group vitamins: Secondary | ICD-10-CM | POA: Insufficient documentation

## 2021-08-17 DIAGNOSIS — Z923 Personal history of irradiation: Secondary | ICD-10-CM | POA: Insufficient documentation

## 2021-08-17 DIAGNOSIS — Z9221 Personal history of antineoplastic chemotherapy: Secondary | ICD-10-CM | POA: Insufficient documentation

## 2021-08-17 DIAGNOSIS — Z79899 Other long term (current) drug therapy: Secondary | ICD-10-CM | POA: Insufficient documentation

## 2021-08-17 DIAGNOSIS — N200 Calculus of kidney: Secondary | ICD-10-CM | POA: Insufficient documentation

## 2021-08-17 DIAGNOSIS — R682 Dry mouth, unspecified: Secondary | ICD-10-CM | POA: Diagnosis not present

## 2021-08-17 DIAGNOSIS — C07 Malignant neoplasm of parotid gland: Secondary | ICD-10-CM | POA: Insufficient documentation

## 2021-08-17 DIAGNOSIS — Z9884 Bariatric surgery status: Secondary | ICD-10-CM | POA: Insufficient documentation

## 2021-08-17 LAB — COMPREHENSIVE METABOLIC PANEL
ALT: 32 U/L (ref 0–44)
AST: 37 U/L (ref 15–41)
Albumin: 4 g/dL (ref 3.5–5.0)
Alkaline Phosphatase: 56 U/L (ref 38–126)
Anion gap: 4 — ABNORMAL LOW (ref 5–15)
BUN: 19 mg/dL (ref 8–23)
CO2: 27 mmol/L (ref 22–32)
Calcium: 8.7 mg/dL — ABNORMAL LOW (ref 8.9–10.3)
Chloride: 105 mmol/L (ref 98–111)
Creatinine, Ser: 1.05 mg/dL (ref 0.61–1.24)
GFR, Estimated: >60 mL/min (ref >60)
Glucose, Bld: 84 mg/dL (ref 70–99)
Potassium: 3.8 mmol/L (ref 3.5–5.1)
Sodium: 136 mmol/L (ref 135–145)
Total Bilirubin: 0.4 mg/dL (ref 0.3–1.2)
Total Protein: 7 g/dL (ref 6.5–8.1)

## 2021-08-17 LAB — CBC WITH DIFFERENTIAL/PLATELET
Abs Immature Granulocytes: 0.02 10*3/uL (ref 0.00–0.07)
Basophils Absolute: 0 10*3/uL (ref 0.0–0.1)
Basophils Relative: 1 %
Eosinophils Absolute: 0.1 10*3/uL (ref 0.0–0.5)
Eosinophils Relative: 3 %
HCT: 34.6 % — ABNORMAL LOW (ref 39.0–52.0)
Hemoglobin: 11.2 g/dL — ABNORMAL LOW (ref 13.0–17.0)
Immature Granulocytes: 1 %
Lymphocytes Relative: 26 %
Lymphs Abs: 1.1 10*3/uL (ref 0.7–4.0)
MCH: 32.2 pg (ref 26.0–34.0)
MCHC: 32.4 g/dL (ref 30.0–36.0)
MCV: 99.4 fL (ref 80.0–100.0)
Monocytes Absolute: 0.4 10*3/uL (ref 0.1–1.0)
Monocytes Relative: 10 %
Neutro Abs: 2.5 10*3/uL (ref 1.7–7.7)
Neutrophils Relative %: 59 %
Platelets: 177 10*3/uL (ref 150–400)
RBC: 3.48 MIL/uL — ABNORMAL LOW (ref 4.22–5.81)
RDW: 13.3 % (ref 11.5–15.5)
WBC: 4.1 10*3/uL (ref 4.0–10.5)
nRBC: 0 % (ref 0.0–0.2)

## 2021-08-17 NOTE — Progress Notes (Signed)
Radiation Oncology ?Follow up Note ? ?Name: [name]   ?Date:   08/17/2021 ?MRN:  [id number] ?DOB: December 01, 1955  ? ? ?This 66 y.o. male presents to the clinic today for 25-monthfollow-up status post concurrent chemoradiation therapy for squamous of carcinoma of the parotid.. ? ?REFERRING PROVIDER: MRusty Aus MD ? ?HPI: Patient is a 66year old male now out 10 months having completed concurrent chemoradiation therapy to his left parotid bed status post resection for slowly growing mass PET positive and biopsy positive for nonkeratinizing squamous cell carcinoma.  He has recently undergone multiple instrumentation of his cervical spine including at least 2-3 procedures with both posterior and anterior approaches.  He is having some swallowing problems since his initial cervical surgery.  He had a.  PET CT scan back in December showing no evidence of hypermetabolic activity consistent with complete response. ? ?COMPLICATIONS OF TREATMENT: none ? ?FOLLOW UP COMPLIANCE: keeps appointments  ? ?PHYSICAL EXAM:  ?There were no vitals taken for this visit. ?Patient is in a cervical collar.  Neck is clear without evidence of cervical adenopathy.  Parotid bed is clear except for 2 small nodules which may be suture material.    Well-developed well-nourished patient in NAD. HEENT reveals PERLA, EOMI, discs not visualized.  Oral cavity is clear. No oral mucosal lesions are identified. Neck is clear without evidence of cervical or supraclavicular adenopathy. Lungs are clear to A&P. Cardiac examination is essentially unremarkable with regular rate and rhythm without murmur rub or thrill. Abdomen is benign with no organomegaly or masses noted. Motor sensory and DTR levels are equal and symmetric in the upper and lower extremities. Cranial nerves II through XII are grossly intact. Proprioception is intact. No peripheral adenopathy or edema is identified. No motor or sensory levels are noted. Crude visual fields are within  normal range. ? ?RADIOLOGY RESULTS: PET CT scan reviewed compatible with above-stated findings ? ?PLAN: Present time patient is doing well with no evidence of disease.  Recovering from his cervical spine surgery.  2 little areas in his left parotid bed may be suture material he anticipates biopsy by dermatology in the near future.  I have asked to see him back in 6 months for follow-up and then will start once year visits.  Patient knows to call with any concerns.  He continues close follow-up care with medical oncology as well as ENT. ? ?I would like to take this opportunity to thank you for allowing me to participate in the care of your patient.. ?  ? GNoreene Filbert MD ? ?

## 2021-08-18 ENCOUNTER — Encounter: Payer: Self-pay | Admitting: Oncology

## 2021-08-22 ENCOUNTER — Encounter: Payer: Self-pay | Admitting: Oncology

## 2021-10-12 ENCOUNTER — Encounter: Payer: Self-pay | Admitting: Oncology

## 2021-12-13 ENCOUNTER — Telehealth: Payer: Self-pay | Admitting: Oncology

## 2021-12-13 NOTE — Telephone Encounter (Signed)
Pt called to cancel appts, Has been refered to [facility], [facility] of Cancer and spread.

## 2022-02-13 IMAGING — PT NM PET TUM IMG RESTAG (PS) SKULL BASE T - THIGH
1 of 10 series · 1 of 25 positions shown · non-contrast
Comparison: PET-CT from 03/04/2020

CLINICAL DATA: Subsequent treatment strategy for squamous cell
carcinoma of the left parotid gland. Recent chemotherapy and
radiation therapy with round completed in May 2020.

EXAM:
NUCLEAR MEDICINE PET SKULL BASE TO THIGH
TECHNIQUE: 10.6 mCi F-18 FDG was injected intravenously. Full-ring PET imaging
was performed from the skull base to thigh after the radiotracer. CT
data was obtained and used for attenuation correction and anatomic
localization.
Fasting blood glucose: 71 mg/dl

[Series 3: ct wb 5.0 b30f · axial · 5.0mm · 0.98mm/px · 1 of 329 slices shown]
[im 329/329  brain]
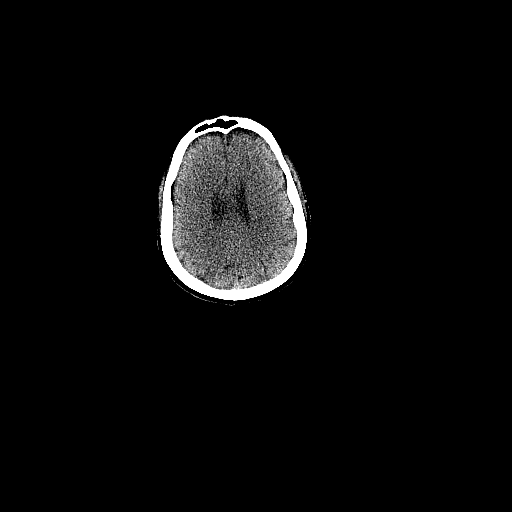

[1 of 25 positions shown; findings below may reference images not displayed]

FINDINGS: Mediastinal blood pool activity: SUV max

Liver activity: SUV max NA

NECK: At the site of the prior mass involving the deep inferior
margin of the prostate gland, there is currently only a band of soft
tissue density measuring about 0.5 cm in thickness (mass previously
measuring 2.5 by 2.1 cm). This band has a maximum SUV of
(previously 14.4), which is a similar activity level to the
contralateral side, without residual hypermetabolic activity in this
region. No new adenopathy or new abnormal activity in the neck.

Incidental CT findings: Bilateral common carotid atherosclerotic
calcification.

CHEST: No significant abnormal hypermetabolic activity in this
region.

Incidental CT findings: Coronary, aortic arch, and branch vessel
atherosclerotic vascular disease. Postoperative findings at the
gastroesophageal junction.

ABDOMEN/PELVIS: No significant abnormal hypermetabolic activity in
this region.

Incidental CT findings: Postoperative findings in the stomach
compatible with prior gastric bypass. Cholecystectomy. Aortoiliac
atherosclerotic vascular disease. Prostatomegaly. Stable splenic
artery aneurysm, 2.4 by 1.4 cm.

SKELETON: No significant abnormal hypermetabolic activity in this
region.

Incidental CT findings: Thoracolumbar spondylosis.
IMPRESSION: 1. At the site of the prior left parotid mass, there is only a thin
band of residual soft tissue density which is not currently
hypermetabolic. This reflects excellent response to therapy.
2. No new areas of concerning hypermetabolic activity.
3. Other imaging findings of potential clinical significance: Aortic
Atherosclerosis ([id number]). Coronary atherosclerosis. Prior
gastric bypass. Prostatomegaly. Stable splenic artery aneurysm.
Thoracolumbar spondylosis.

## 2022-02-23 ENCOUNTER — Ambulatory Visit: Payer: Medicare Other | Admitting: Radiation Oncology

## 2022-02-23 ENCOUNTER — Ambulatory Visit: Payer: Medicare Other | Admitting: Oncology

## 2022-02-23 ENCOUNTER — Other Ambulatory Visit: Payer: Medicare Other

## 2022-09-27 ENCOUNTER — Other Ambulatory Visit: Payer: Self-pay | Admitting: Hematology and Oncology
